# Patient Record
Sex: Female | Born: 1980 | Race: White | Hispanic: No | Marital: Single | Smoking: Current every day smoker
Health system: Southern US, Community
[De-identification: ages and names within clinical notes are randomized; demographics above are authoritative.]

## PROBLEM LIST (undated history)

## (undated) DIAGNOSIS — F419 Anxiety disorder, unspecified: Secondary | ICD-10-CM

## (undated) DIAGNOSIS — N200 Calculus of kidney: Secondary | ICD-10-CM

## (undated) DIAGNOSIS — E039 Hypothyroidism, unspecified: Secondary | ICD-10-CM

## (undated) DIAGNOSIS — E0500 Thyrotoxicosis with diffuse goiter without thyrotoxic crisis or storm: Secondary | ICD-10-CM

## (undated) DIAGNOSIS — C73 Malignant neoplasm of thyroid gland: Secondary | ICD-10-CM

## (undated) HISTORY — DX: Thyrotoxicosis with diffuse goiter without thyrotoxic crisis or storm: E05.00

## (undated) HISTORY — DX: Calculus of kidney: N20.0

## (undated) HISTORY — DX: Hypothyroidism, unspecified: E03.9

## (undated) HISTORY — DX: Anxiety disorder, unspecified: F41.9

## (undated) HISTORY — DX: Malignant neoplasm of thyroid gland: C73

---

## 2001-06-02 ENCOUNTER — Emergency Department (HOSPITAL_COMMUNITY): Admission: EM | Admit: 2001-06-02 | Discharge: 2001-06-02 | Payer: Self-pay | Admitting: Emergency Medicine

## 2003-12-21 ENCOUNTER — Inpatient Hospital Stay: Payer: Self-pay | Admitting: Gynecology

## 2004-07-25 ENCOUNTER — Ambulatory Visit (HOSPITAL_COMMUNITY): Admission: RE | Admit: 2004-07-25 | Discharge: 2004-07-25 | Payer: Self-pay | Admitting: Gynecology

## 2004-08-25 ENCOUNTER — Emergency Department: Payer: Self-pay | Admitting: Emergency Medicine

## 2004-09-24 ENCOUNTER — Encounter (HOSPITAL_COMMUNITY): Admission: RE | Admit: 2004-09-24 | Discharge: 2004-11-27 | Payer: Self-pay | Admitting: Endocrinology

## 2004-10-09 ENCOUNTER — Ambulatory Visit (HOSPITAL_COMMUNITY): Admission: RE | Admit: 2004-10-09 | Discharge: 2004-10-09 | Payer: Self-pay | Admitting: Endocrinology

## 2004-10-18 ENCOUNTER — Emergency Department: Payer: Self-pay | Admitting: Internal Medicine

## 2005-06-25 ENCOUNTER — Emergency Department: Payer: Self-pay | Admitting: Emergency Medicine

## 2005-10-09 ENCOUNTER — Encounter: Admission: RE | Admit: 2005-10-09 | Discharge: 2005-10-09 | Payer: Self-pay | Admitting: Unknown Physician Specialty

## 2008-01-07 ENCOUNTER — Encounter: Payer: Self-pay | Admitting: Family Medicine

## 2008-01-07 ENCOUNTER — Ambulatory Visit: Payer: Self-pay | Admitting: Family Medicine

## 2008-09-20 ENCOUNTER — Ambulatory Visit: Payer: Self-pay | Admitting: Obstetrics & Gynecology

## 2009-06-22 ENCOUNTER — Ambulatory Visit: Payer: Self-pay | Admitting: Obstetrics & Gynecology

## 2010-07-17 NOTE — Assessment & Plan Note (Signed)
Latasha Singleton, Latasha Singleton                  ACCOUNT NO.:  [id number]   MEDICAL RECORD NO.:  [id number]          PATIENT TYPE:  POB   LOCATION:  [facility] at [facility]         FACILITY:  [facility]   PHYSICIAN:  Tinnie Gens, MD        DATE OF BIRTH:  1980/07/04   DATE OF SERVICE:  01/07/2008                                  CLINIC NOTE   CHIEF COMPLAINT:  Yearly exam and Pap.   HISTORY OF PRESENT ILLNESS:  Patient is a 30 year old gravida 2, para 2  who is a single mom.  She has had Graves disease and ablation and now  she is hypothyroid.  The patient saw her thyroid doctor who has replaced  Dr. Talmage Nap at [facility] 2 weeks ago and told her her thyroid levels were  normal.  The patient is upset because she has continued panic attacks  and weight gain and generalized anxiety.  She says she is not depressed  but she is just constantly worried and her fears are somewhat  irrational.  She thinks her son is going to die of swine flu.  Patient  also reports panic attacks much worse when she is out in the car.  Patient had a therapist for postpartum depression after the birth of her  son but has not seen one since.  She reports that her endocrinologist  tried to prescribe her Paxil but she was unwilling to take that when she  saw him.  She has a [employer] fast streamer for contraception and that seems to be  working well for her.  Her last Pap was in June of 2007.   PAST MEDICAL HISTORY:  Significant for thyroid problems.   PAST SURGICAL HISTORY:  Negative.   MEDICATIONS:  1. Levoxyl.  2. Ibuprofen as needed.   ALLERGIES:  NONE KNOWN.   OB HISTORY:  She is a G2 P2, 2 vaginal deliveries.   GYN HISTORY:  Menarche at age 63.  Cycles last for 2 days and have light  flow with the Mirena in.  She has no history of abnormal Pap smears.   FAMILY HISTORY:  Diabetes type 2 in her father.   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  She does smoke a 1/2 pack per day for the past  7  years.  She does not drink alcohol or do any other drugs.   Fourteen-point review of systems were reviewed.  Please see GYN history  on the chart.  Positive for weight gain.   EXAM:  VITALS:  Are as noted in the chart.  She is a well-developed,  well-nourished female in no acute distress.  Her weight is 160 pounds.  HEENT:  Normocephalic, atraumatic.  Sclerae are anicteric.  NECK:  Supple.  Normal thyroid.  LUNGS:  Clear bilaterally.  CV:  Regular rate and rhythm.  No rales, gallops, or murmurs.  ABDOMEN:  Soft, nontender, nondistended.  GU:  Normal external female genitalia.  BUS normal.  Vagina is pink and  rugated.  Cervix is parous without lesion.  Uterus is small, anteverted.  No adnexal mass or tenderness.  EXTREMITIES:  No cyanosis, clubbing, or edema.  BREASTS:  Symmetrical with everted nipples.  No masses.  No  supraclavicular or axillary adenopathy.   IMPRESSION:  1. Yearly exam.  2. Generalized anxiety disorder.  3. Hypothyroidism with a history of Graves disease.   PLAN:  1. Pap smear today.  2. We will refer her back to Dr. Talmage Nap for management of her thyroid.  3. We will start her on Lexapro 10 mg 1 p.o. daily to see if this      helps alleviate some of her issues.  She does not want Xanax      although that would be helpful for her panic attacks.  Also, I      have recommended therapy, however, she seems resistant to this      alternative at this time.           ______________________________  Tinnie Gens, MD     TP/MEDQ  D:  01/07/2008  T:  01/07/2008  Job:  [id number]

## 2010-07-17 NOTE — Assessment & Plan Note (Signed)
Latasha Singleton, Latasha Singleton                  ACCOUNT NO.:  [id number]   MEDICAL RECORD NO.:  [id number]          PATIENT TYPE:  POB   LOCATION:  [facility]         FACILITY:  [facility]   PHYSICIAN:  Jaynie Collins, MD     DATE OF BIRTH:  Feb 20, 1981   DATE OF SERVICE:  06/22/2009                                  CLINIC NOTE   CHIEF COMPLAINT:  Left lower back pain, vomiting.   HISTORY OF PRESENT ILLNESS:  The patient is a 30 year old gravida 2,  para 2, who was seen today for evaluation of left lower back pain and  vomiting that started this morning.  The patient does report having  similar episodes during her pregnancy which is attributed to  pyelonephritis for which she was treated.  She denies any fevers,  chills, sweats.  She does endorse some dysuria.  She denies any other  symptoms.   PHYSICAL EXAMINATION:  VITAL SIGNS:  On examination, her temperature is  96.5, pulse 74, blood pressure 135/90, height 5 feet 6 inches, weight  144 pounds.  GENERAL:  No apparent distress.  ABDOMEN:  Suprapubic and lower abdominal and flank pain especially on  her left side.  The patient does have left CVA tenderness.   A urinalysis that was done in the office showed moderate blood with a  small amount of leukocytes.   IMPRESSION:  The patient likely has a urinary tract infection, possible  pyelonephritis, possible kidney stone.   PLAN:  She was given prescription for levofloxacin 500 mg daily for 10  days also given a prescription for Pyridium 100 mg p.o. t.i.d. for 3  days, and ibuprofen as needed for pain.  She was told to drink a lot of  water to see if that can help with her passing the kidney stones.  If it  is a kidney stone, she was told to call back or go to the emergency  room, if she starts to have a lot of vomiting and is unable to tolerate  the antibiotic therapy, or if she has any fevers or any worsening  symptoms as she might need a renal ultrasound to rule out a stone versus  an  abscess, if her symptoms do not improve.  The patient verbalized  understanding of plan and she was told to go immediately to pharmacy and  fill her prescriptions after this encounter and let us know if her  symptoms worsen.           ______________________________  Jaynie Collins, MD     UA/MEDQ  D:  06/22/2009  T:  06/22/2009  Job:  [id number]

## 2010-07-17 NOTE — Assessment & Plan Note (Signed)
Latasha Singleton, Latasha Singleton                  ACCOUNT NO.:  [id number]   MEDICAL RECORD NO.:  [id number]          PATIENT TYPE:  POB   LOCATION:  [facility]         FACILITY:  [facility]   PHYSICIAN:  Scheryl Darter, MD       DATE OF BIRTH:  Nov 25, 1980   DATE OF SERVICE:                                  CLINIC NOTE   The patient is a 30 year old white female, gravida 2, para 2, last  menstrual period was about a week or 2 ago.  She has had light menstrual  periods with Mirena in place, and her last period was little heavier  than usual, but only lasted few days.  Her Mirena was placed over 5  years ago and she would like to have this removed and replaced.  The  procedure for removal and replacement of Mirena was discussed including  the risks of bleeding, pain, infection, uterine perforation, and her  questions were answered and she signed consent.   PHYSICAL EXAMINATION:  GENERAL:  The patient is in no acute distress.  Normal affect.  PELVIC:  External genitalia, vagina, and cervix appeared normal with  fair amount of normal-appearing cervical mucus.  The string was not  visible.   Uterine dressing forceps were used to locate the string and the IUD was  removed intact.  Cervix was prepped with Betadine and grasped with  single-tooth tenaculum.  We are unable to pass the sound without  dilation of the cervix.  Uterus appeared to be retroverted.  Cervix was  dilated sufficiently and passed the uterine sound, and uterus sounded to  9 cm.  Mirena was placed in usual fashion without difficulty after  cervical dilatation.  String was trimmed to about 2 cm.  All instruments  were removed.  The patient tolerated this well.  She will return in 4  weeks for an IUD check, and she was given precautions to notify us for  increased pain, sign of the infection, or heavy bleeding.      Scheryl Darter, MD     JA/MEDQ  D:  09/20/2008  T:  09/21/2008  Job:  [id number]

## 2010-12-04 ENCOUNTER — Encounter: Payer: Self-pay | Admitting: Family Medicine

## 2010-12-04 ENCOUNTER — Ambulatory Visit (INDEPENDENT_AMBULATORY_CARE_PROVIDER_SITE_OTHER): Payer: Medicaid Other | Admitting: Family Medicine

## 2010-12-04 DIAGNOSIS — N200 Calculus of kidney: Secondary | ICD-10-CM | POA: Insufficient documentation

## 2010-12-04 DIAGNOSIS — F419 Anxiety disorder, unspecified: Secondary | ICD-10-CM

## 2010-12-04 DIAGNOSIS — E0500 Thyrotoxicosis with diffuse goiter without thyrotoxic crisis or storm: Secondary | ICD-10-CM | POA: Insufficient documentation

## 2010-12-04 DIAGNOSIS — F411 Generalized anxiety disorder: Secondary | ICD-10-CM

## 2010-12-04 DIAGNOSIS — Z20828 Contact with and (suspected) exposure to other viral communicable diseases: Secondary | ICD-10-CM

## 2010-12-04 DIAGNOSIS — Z205 Contact with and (suspected) exposure to viral hepatitis: Secondary | ICD-10-CM

## 2010-12-04 DIAGNOSIS — E039 Hypothyroidism, unspecified: Secondary | ICD-10-CM | POA: Insufficient documentation

## 2010-12-04 MED ORDER — ALPRAZOLAM 0.25 MG PO TABS: 0.2500 mg | ORAL_TABLET | Freq: Three times a day (TID) | ORAL | Status: AC | PRN

## 2010-12-04 MED ORDER — ESCITALOPRAM OXALATE 10 MG PO TABS: 10.0000 mg | ORAL_TABLET | Freq: Every day | ORAL | Status: DC

## 2010-12-04 NOTE — Progress Notes (Signed)
  Subjective:    Patient ID: Latasha Singleton, female    DOB: Oct 29, 1980, 30 y.o.   MRN: [id number]  HPI Here with lots of anxiety and panic attacks.  Reports that her father has Hep C.  Would like to be checked. 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   Review of Systems  Constitutional: Negative for appetite change.  HENT: Negative for congestion.   Respiratory: Positive for chest tightness and shortness of breath.   Gastrointestinal: Negative for nausea, abdominal pain, diarrhea and constipation.  Genitourinary: Negative for dysuria.  Musculoskeletal: Negative for arthralgias.  Neurological: Positive for tremors and light-headedness.  Psychiatric/Behavioral: Positive for sleep disturbance. Negative for suicidal ideas, hallucinations, confusion and dysphoric mood.       Objective:   Physical Exam  Vitals reviewed. Constitutional: She is oriented to person, place, and time. She appears well-developed and well-nourished.  HENT:  Head: Normocephalic.  Cardiovascular: Normal rate.   Pulmonary/Chest: Effort normal.  Abdominal: Soft.  Neurological: She is alert and oriented to person, place, and time.  Skin: Skin is warm and dry.          Assessment & Plan:  Acute anxiety disorder Panic attacks Trial of Lexapro with Xanax prn panic attacks.

## 2010-12-04 NOTE — Patient Instructions (Signed)
Anxiety and Panic Attacks Your caregiver has informed you that you are having an anxiety or panic attack. There may be many forms of this. Most of the time these attacks come suddenly and without warning. They come at any time of day, including periods of sleep, and at any time of life. They may be strong and unexplained. Although panic attacks are very scary, they are physically harmless. Sometimes the cause of your anxiety is not known. Anxiety is a protective mechanism of the body in its fight or flight mechanism. Most of these perceived danger situations are actually nonphysical situations (such as anxiety over losing a job). CAUSES The causes of an anxiety or panic attack are many. Panic attacks may occur in otherwise healthy people given a certain set of circumstances. There may be a genetic cause for panic attacks. Some medications may also have anxiety as a side effect. SYMPTOMS Some of the most common feelings are:  Intense terror.  Dizziness, feeling faint.   Hot and cold flashes.   Fear of going crazy.   Feelings that nothing is real.   Sweating.   Shaking.   Chest pain or a fast heartbeat (palpitations).  Smothering, choking sensations.   Feelings of impending doom and that death is near.   Tingling of extremities, this may be from over breathing.   Altered reality (derealization).   Being detached from yourself (depersonalization).   Several symptoms can be present to make up anxiety or panic attacks. DIAGNOSIS The evaluation by your caregiver will depend on the type of symptoms you are experiencing. The diagnosis of anxiety or pain attack is made when no physical illness can be determined to be a cause of the symptoms. TREATMENT Treatment to prevent anxiety and panic attacks may include:  Avoidance of circumstances that cause anxiety.   Reassurance and relaxation.   Regular exercise.   Relaxation therapies, such as yoga.   Psychotherapy with a psychiatrist  or therapist.   Avoidance of caffeine, alcohol and illegal drugs.   Prescribed medication.  SEEK IMMEDIATE MEDICAL CARE IF:  You experience panic attack symptoms that are different than your usual symptoms.   You have any worsening or concerning symptoms.  Document Released: 02/18/2005 Document Re-Released: 08/08/2009 [employer] Patient Information 2011 [employer]. 

## 2011-03-18 ENCOUNTER — Other Ambulatory Visit: Payer: Self-pay | Admitting: *Deleted

## 2011-03-18 MED ORDER — ESCITALOPRAM OXALATE 10 MG PO TABS: 10.0000 mg | ORAL_TABLET | Freq: Every day | ORAL | Status: DC

## 2011-03-18 NOTE — Telephone Encounter (Signed)
Patient is in need of her Lexapro refill.  She made appointment for Feb 5 with Dr. Shawnie Pons.  She is doing great with the Lexapro!

## 2011-04-09 ENCOUNTER — Ambulatory Visit: Payer: Medicaid Other | Admitting: Family Medicine

## 2011-04-29 ENCOUNTER — Ambulatory Visit (INDEPENDENT_AMBULATORY_CARE_PROVIDER_SITE_OTHER): Payer: Medicaid Other | Admitting: Family Medicine

## 2011-04-29 ENCOUNTER — Other Ambulatory Visit (HOSPITAL_COMMUNITY)
Admission: RE | Admit: 2011-04-29 | Discharge: 2011-04-29 | Disposition: A | Discharge status: Home / Self Care | Payer: Medicaid Other | Source: Ambulatory Visit | Attending: Family Medicine | Admitting: Family Medicine

## 2011-04-29 ENCOUNTER — Encounter: Payer: Self-pay | Admitting: Family Medicine

## 2011-04-29 VITALS — BP 106/63 | HR 79 | Ht 65.5 in | Wt 156.0 lb

## 2011-04-29 DIAGNOSIS — Z01419 Encounter for gynecological examination (general) (routine) without abnormal findings: Secondary | ICD-10-CM | POA: Insufficient documentation

## 2011-04-29 DIAGNOSIS — F419 Anxiety disorder, unspecified: Secondary | ICD-10-CM

## 2011-04-29 DIAGNOSIS — E039 Hypothyroidism, unspecified: Secondary | ICD-10-CM

## 2011-04-29 DIAGNOSIS — F411 Generalized anxiety disorder: Secondary | ICD-10-CM

## 2011-04-29 MED ORDER — LEVOTHYROXINE SODIUM 125 MCG PO TABS: 125.0000 ug | ORAL_TABLET | Freq: Every day | ORAL | Status: DC

## 2011-04-29 MED ORDER — ESCITALOPRAM OXALATE 20 MG PO TABS: 10.0000 mg | ORAL_TABLET | Freq: Every day | ORAL | Status: DC

## 2011-04-29 NOTE — Progress Notes (Signed)
  Subjective:     Latasha Singleton is a 31 y.o. female and is here for a comprehensive physical exam. The patient reports problems - Feeling better with switch from Celexa to Lexapro.Marland Kitchen  History   Social History  . Marital Status: Married    Spouse Name: N/A    Number of Children: N/A  . Years of Education: N/A   Occupational History  . Not on file.   Social History Main Topics  . Smoking status: Current Everyday Smoker -- 1.0 packs/day    Types: Cigarettes  . Smokeless tobacco: Not on file  . Alcohol Use: Yes     occassionally  . Drug Use: No  . Sexually Active: Not Currently -- Female partner(s)   Other Topics Concern  . Not on file   Social History Narrative  . No narrative on file   Health Maintenance  Topic Date Due  . Pap Smear  10/01/1998  . Tetanus/tdap  10/01/1999  . Influenza Vaccine  12/03/2010    The following portions of the patient's history were reviewed and updated as appropriate: allergies, current medications, past family history, past medical history, past social history, past surgical history and problem list.  Review of Systems A comprehensive review of systems was negative.   Objective:    BP 106/63  Pulse 79  Ht 5' 5.5" (1.664 m)  Wt 156 lb (70.761 kg)  BMI 25.56 kg/m2  LMP 04/15/2011 General appearance: alert, cooperative and appears stated age Head: Normocephalic, without obvious abnormality, atraumatic Neck: no adenopathy, supple, symmetrical, trachea midline and thyroid not enlarged, symmetric, no tenderness/mass/nodules Lungs: clear to auscultation bilaterally Breasts: normal appearance, no masses or tenderness Heart: regular rate and rhythm, S1, S2 normal, no murmur, click, rub or gallop Abdomen: soft, non-tender; bowel sounds normal; no masses,  no organomegaly Pelvic: cervix normal in appearance, external genitalia normal, no adnexal masses or tenderness, no cervical motion tenderness, uterus normal size, shape, and consistency and vagina  normal without discharge Extremities: extremities normal, atraumatic, no cyanosis or edema Pulses: 2+ and symmetric Skin: Skin color, texture, turgor normal. No rashes or lesions Lymph nodes: Cervical, supraclavicular, and axillary nodes normal. Neurologic: Grossly normal    Assessment:    Healthy female exam.  GAD with panic attacks, improved.     Plan:    Pap smear today Increase her lexapro to 20. See After Visit Summary for Counseling Recommendations

## 2011-04-29 NOTE — Patient Instructions (Signed)
Anxiety and Panic Attacks Your caregiver has informed you that you are having an anxiety or panic attack. There may be many forms of this. Most of the time these attacks come suddenly and without warning. They come at any time of day, including periods of sleep, and at any time of life. They may be strong and unexplained. Although panic attacks are very scary, they are physically harmless. Sometimes the cause of your anxiety is not known. Anxiety is a protective mechanism of the body in its fight or flight mechanism. Most of these perceived danger situations are actually nonphysical situations (such as anxiety over losing a job). CAUSES  The causes of an anxiety or panic attack are many. Panic attacks may occur in otherwise healthy people given a certain set of circumstances. There may be a genetic cause for panic attacks. Some medications may also have anxiety as a side effect. SYMPTOMS  Some of the most common feelings are:  Intense terror.   Dizziness, feeling faint.   Hot and cold flashes.   Fear of going crazy.   Feelings that nothing is real.   Sweating.   Shaking.   Chest pain or a fast heartbeat (palpitations).   Smothering, choking sensations.   Feelings of impending doom and that death is near.   Tingling of extremities, this may be from over-breathing.   Altered reality (derealization).   Being detached from yourself (depersonalization).  Several symptoms can be present to make up anxiety or panic attacks. DIAGNOSIS  The evaluation by your caregiver will depend on the type of symptoms you are experiencing. The diagnosis of anxiety or panic attack is made when no physical illness can be determined to be a cause of the symptoms. TREATMENT  Treatment to prevent anxiety and panic attacks may include:  Avoidance of circumstances that cause anxiety.   Reassurance and relaxation.   Regular exercise.   Relaxation therapies, such as yoga.   Psychotherapy with a  psychiatrist or therapist.   Avoidance of caffeine, alcohol and illegal drugs.   Prescribed medication.  SEEK IMMEDIATE MEDICAL CARE IF:   You experience panic attack symptoms that are different than your usual symptoms.   You have any worsening or concerning symptoms.  Document Released: 02/18/2005 Document Revised: 10/31/2010 Document Reviewed: 06/22/2009 [facility] Patient Information 2012 [place], [place].Preventative Care for Adults, Female A healthy lifestyle and preventative care can promote health and wellness. Preventative health guidelines for women include the following key practices:  A routine yearly physical is a good way to check with your caregiver about your health and preventative screening. It is a chance to share any concerns and updates on your health, and to receive a thorough exam.   Visit your dentist for a routine exam and preventative care every 6 months. Brush your teeth twice a day and floss once a day. Good oral hygiene prevents tooth decay and gum disease.   The frequency of eye exams is based on your age, health, family medical history, use of contact lenses, and other factors. Follow your caregiver's recommendations for frequency of eye exams.   Eat a healthy diet. Foods like vegetables, fruits, whole grains, low-fat dairy products, and lean protein foods contain the nutrients you need without too many calories. Decrease your intake of foods high in solid fats, added sugars, and salt. Eat the right amount of calories for you.Get information about a proper diet from your caregiver, if necessary.   Regular physical exercise is one of the most important things you  can do for your health. Most adults should get at least 150 minutes of moderate-intensity exercise (any activity that increases your heart rate and causes you to sweat) each week. In addition, most adults need muscle-strengthening exercises on 2 or more days a week.   Maintain a healthy weight. The body  mass index (BMI) is a screening tool to identify possible weight problems. It provides an estimate of body fat based on height and weight. Your caregiver can help determine your BMI, and can help you achieve or maintain a healthy weight.For adults 20 years and older:   A BMI below 18.5 is considered underweight.   A BMI of 18.5 to 24.9 is normal.   A BMI of 25 to 29.9 is considered overweight.   A BMI of 30 and above is considered obese.   Maintain normal blood lipids and cholesterol levels by exercising and minimizing your intake of saturated fat. Eat a balanced diet with plenty of fruit and vegetables. Blood tests for lipids and cholesterol should begin at age 69 and be repeated every 5 years. If your lipid or cholesterol levels are high, you are over 50, or you are a high risk for heart disease, you may need your cholesterol levels checked more frequently.Ongoing high lipid and cholesterol levels should be treated with medicines if diet and exercise are not effective.   If you smoke, find out from your caregiver how to quit. If you do not use tobacco, do not start.   If you are pregnant, do not drink alcohol. If you are breastfeeding, be very cautious about drinking alcohol. If you are not pregnant and choose to drink alcohol, do not exceed 1 drink per day. One drink is considered to be 12 ounces (355 mL) of beer, 5 ounces (148 mL) of wine, or 1.5 ounces (44 mL) of liquor.   Avoid use of street drugs. Do not share needles with anyone. Ask for help if you need support or instructions about stopping the use of drugs.   High blood pressure causes heart disease and increases the risk of stroke. Your blood pressure should be checked at least every 1 to 2 years. Ongoing high blood pressure should be treated with medicines if weight loss and exercise are not effective.   If you are 41 to 31 years old, ask your caregiver if you should take aspirin to prevent strokes.   Diabetes screening involves  taking a blood sample to check your fasting blood sugar level. This should be done once every 3 years, after age 58, if you are within normal weight and without risk factors for diabetes. Testing should be considered at a younger age or be carried out more frequently if you are overweight and have at least 1 risk factor for diabetes.   Breast cancer screening is essential preventative care for women. You should practice "breast self-awareness." This means understanding the normal appearance and feel of your breasts and may include breast self-examination. Any changes detected, no matter how small, should be reported to a caregiver. Women in their 3s and 30s should have a clinical breast exam (CBE) by a caregiver as part of a regular health exam every 1 to 3 years. After age 32, women should have a CBE every year. Starting at age 58, women should consider having a mammogram (breast X-ray) every year. Women who have a family history of breast cancer should talk to their caregiver about genetic screening. Women at a high risk of breast cancer should talk  to their caregiver about having an MRI and a mammogram every year.   The Pap test is a screening test for cervical cancer. A Pap test can show cell changes on the cervix that might become cervical cancer if left untreated. A Pap test is a procedure in which cells are obtained and examined from the lower end of the uterus (cervix).   Women should have a Pap test starting at age 31.   Between ages 47 and 28, Pap tests should be repeated every 2 years.   Beginning at age 65, you should have a Pap test every 3 years as long as the past 3 Pap tests have been normal.   Some women have medical problems that increase the chance of getting cervical cancer. Talk to your caregiver about these problems. It is especially important to talk to your caregiver if a new problem develops soon after your last Pap test. In these cases, your caregiver may recommend more  frequent screening and Pap tests.   The above recommendations are the same for women who have or have not gotten the vaccine for human papillomavirus (HPV).   If you had a hysterectomy for a problem that was not cancer or a condition that could lead to cancer, then you no longer need Pap tests. Even if you no longer need a Pap test, a regular exam is a good idea to make sure no other problems are starting.   If you are between ages 74 and 7, and you have had normal Pap tests going back 10 years, you no longer need Pap tests. Even if you no longer need a Pap test, a regular exam is a good idea to make sure no other problems are starting.   If you have had past treatment for cervical cancer or a condition that could lead to cancer, you need Pap tests and screening for cancer for at least 20 years after your treatment.   If Pap tests have been discontinued, risk factors (such as a new sexual partner) need to be reassessed to determine if screening should be resumed.   The HPV test is an additional test that may be used for cervical cancer screening. The HPV test looks for the virus that can cause the cell changes on the cervix. The cells collected during the Pap test can be tested for HPV. The HPV test could be used to screen women aged 22 years and older, and should be used in women of any age who have unclear Pap test results. After the age of 20, women should have HPV testing at the same frequency as a Pap test.   Colorectal cancer can be detected and often prevented. Most routine colorectal cancer screening begins at the age of 58 and continues through age 23. However, your caregiver may recommend screening at an earlier age if you have risk factors for colon cancer. On a yearly basis, your caregiver may provide home test kits to check for hidden blood in the stool. Use of a small camera at the end of a tube, to directly examine the colon (sigmoidoscopy or colonoscopy), can detect the earliest forms  of colorectal cancer. Talk to your caregiver about this at age 33, when routine screening begins. Direct examination of the colon should be repeated every 5 to 10 years through age 27, unless early forms of pre-cancerous polyps or small growths are found.   Practice safe sex. Use condoms and avoid high-risk sexual practices to reduce the spread of  sexually transmitted infections (STIs). STIs include gonorrhea, chlamydia, syphilis, trichomonas, herpes, HPV, and human immunodeficiency virus (HIV). Herpes, HIV, and HPV are viral illnesses that have no cure. They can result in disability, cancer, and death. Sexually active women aged 61 and younger should be checked for Chlamydia. Older women with new or multiple partners should also be tested for Chlamydia. Testing for other STIs is recommended if you are sexually active and at increased risk.   Osteoporosis is a disease in which the bones lose minerals and strength with aging. This can result in serious bone fractures. The risk of osteoporosis can be identified using a bone density scan. Women ages 63 and over and women at risk for fractures or osteoporosis should discuss screening with their caregivers. Ask your caregiver whether you should take a calcium supplement or vitamin D to reduce the rate of osteoporosis.   Menopause can be associated with physical symptoms and risks. Hormone replacement therapy is available to decrease symptoms and risks. You should talk to your caregiver about whether hormone replacement therapy is right for you.   Use sunscreen with skin protection factor (SPF) of 30 or more. Apply sunscreen liberally and repeatedly throughout the day. You should seek shade when your shadow is shorter than you. Protect yourself by wearing long sleeves, pants, a wide-brimmed hat, and sunglasses year round, whenever you are outdoors.   Once a month, do a whole body skin exam, using a mirror to look at the skin on your back. Notify your caregiver  of new moles, moles that have irregular borders, moles that are larger than a pencil eraser, or moles that have changed in shape or color.   Stay current with required immunizations.   Influenza. You need a dose every fall (or winter). The composition of the flu vaccine changes each year, so being vaccinated once is not enough.   Pneumococcal polysaccharide. You need 1 to 2 doses if you smoke cigarettes or if you have certain chronic medical conditions. You need 1 dose at age 51 (or older) if you have never been vaccinated.   Tetanus, diphtheria, pertussis (Tdap, Td). Get 1 dose of Tdap vaccine if you are younger than age 44 years, are over 52 and have contact with an infant, are a Research scientist (physical sciences), are pregnant, or simply want to be protected from whooping cough. After that, you need a Td booster dose every 10 years. Consult your caregiver if you have not had at least 3 tetanus and diphtheria-containing shots sometime in your life or have a deep or dirty wound.   HPV. You need this vaccine if you are a woman age 58 years or younger. The vaccine is given in 3 doses over 6 months.   Measles, mumps, rubella (MMR). You need at least 1 dose of MMR if you were born in 1957 or later. You may also need a 2nd dose.   Meningococcal. If you are age 70 to 46 years and a Orthoptist living in a residence hall, or have one of several medical conditions, you need to get vaccinated against meningococcal disease. You may also need additional booster doses.   Zoster (shingles). If you are age 110 years or older, you should get this vaccine.   Varicella (chickenpox). If you have never had chickenpox or you were vaccinated but received only 1 dose, talk to your caregiver to find out if you need this vaccine.   Hepatitis A. You need this vaccine if you have a specific risk  factor for hepatitis A virus infection or you simply wish to be protected from this disease. The vaccine is usually given as 2  doses, 6 to 18 months apart.   Hepatitis B. You need this vaccine if you have a specific risk factor for hepatitis B virus infection or you simply wish to be protected from this disease. The vaccine is given in 3 doses, usually over 6 months.  Preventative Services / Frequency Ages 12 to 43  Blood pressure check.** / Every 1 to 2 years.   Lipid and cholesterol check.**/ Every 5 years beginning at age 25.   Clinical breast exam.** / Every 3 years for women in their 49s and 30s.   Pap Test.** / Every 2 years from ages 22 through 41. Every 3 years starting at age 21 years through age 56 or 43 with a history of 3 consecutive normal Pap tests.   HPV Screening.** / Every 3 years from ages 66 through ages 104 to 58 with a history of 3 consecutive normal Pap tests.   Skin self-exam. / Monthly.   Influenza immunization.** / Every year.   Pneumococcal polysaccharide immunization.** / 1 to 2 doses if you smoke cigarettes or if you have certain chronic medical conditions.   Tetanus, diphtheria, pertussis (Tdap,Td) immunization. / A one-time dose of Tdap vaccine. After that, you need a Td booster dose every 10 years.   HPV immunization. / 3 doses over 6 months, if 26 and younger.   Measles, mumps, rubella (MMR) immunization. / You need at least 1 dose of MMR if you were born in 1957 or later. You may also need a 2nd dose.   Meningococcal immunization. / 1 dose if you are age 76 to 6 years and a Orthoptist living in a residence hall, or have one of several medical conditions, you need to get vaccinated against meningococcal disease. You may also need additional booster doses.   Varicella immunization. **/ Consult your caregiver.   Hepatitis A immunization. ** / Consult your caregiver. 2 doses, 6 to 18 months apart.   Hepatitis B immunization.** / Consult your caregiver. 3 doses usually over 6 months.  Ages 40 to 92  Blood pressure check.** / Every 1 to 2 years.   Lipid and  cholesterol check.**/ Every 5 years beginning at age 72.   Clinical breast exam.** / Every year after age 31.   Mammogram.** / Every year beginning at age 46 and continuing for as long as you are in good health. Consult with your caregiver.   Pap Test.** / Every 3 years starting at age 21 years through age 79 or 58 with a history of 3 consecutive normal Pap tests.   HPV Screening.** / Every 3 years from ages 37 through ages 40 to 64 with a history of 3 consecutive normal Pap tests.   Fecal occult blood test (FOBT) of stool. / Every year beginning at age 75 and continuing until age 11. You may not have to do this test if you get colonoscopy every 10 years.   Flexible sigmoidoscopy** or colonoscopy.** / Every 5 years for a flexible sigmoidoscopy or every 10 years for a colonoscopy beginning at age 57 and continuing until age 69.   Skin self-exam. / Monthly.   Influenza immunization.** / Every year.   Pneumococcal polysaccharide immunization.** / 1 to 2 doses if you smoke cigarettes or if you have certain chronic medical conditions.   Tetanus, diphtheria, pertussis (Tdap/Td) immunization.** / A one-time dose  of Tdap vaccine. After that, you need a Td booster dose every 10 years.   Measles, mumps, rubella (MMR) immunization. / You need at least 1 dose of MMR if you were born in 1957 or later. You may also need a 2nd dose.   Varicella immunization. **/ Consult your caregiver.   Meningococcal immunization.** / Consult your caregiver.     Hepatitis A immunization. ** / Consult your caregiver. 2 doses, 6 to 18 months apart.   Hepatitis B immunization.** / Consult your caregiver. 3 doses, usually over 6 months.  Ages 57 and over  Blood pressure check.** / Every 1 to 2 years.   Lipid and cholesterol check.**/ Every 5 years beginning at age 80.   Clinical breast exam.** / Every year after age 53.   Mammogram.** / Every year beginning at age 58 and continuing for as long as you are in  good health. Consult with your caregiver.   Pap Test,** / Every 3 years starting at age 57 years through age 82 or 77 with a 3 consecutive normal Pap tests. Testing can be stopped between 65 and 70 with 3 consecutive normal Pap tests and no abnormal Pap or HPV tests in the past 10 years.   HPV Screening.** / Every 3 years from ages 41 through ages 68 or 45 with a history of 3 consecutive normal Pap tests. Testing can be stopped between 65 and 70 with 3 consecutive normal Pap tests and no abnormal Pap or HPV tests in the past 10 years.   Fecal occult blood test (FOBT) of stool. / Every year beginning at age 57 and continuing until age 22. You may not have to do this test if you get colonoscopy every 10 years.   Flexible sigmoidoscopy** or colonoscopy.** / Every 5 years for a flexible sigmoidoscopy or every 10 years for a colonoscopy beginning at age 75 and continuing until age 77.   Osteoporosis screening.** / A one-time screening for women ages 43 and over and women at risk for fractures or osteoporosis.   Skin self-exam. / Monthly.   Influenza immunization.** / Every year.   Pneumococcal polysaccharide immunization.** / 1 dose at age 33 (or older) if you have never been vaccinated.   Tetanus, diphtheria, pertussis (Tdap, Td) immunization. / A one-time dose of Tdap vaccine if you are over 65 and have contact with an infant, are a Research scientist (physical sciences), or simply want to be protected from whooping cough. After that, you need a Td booster dose every 10 years.   Varicella immunization. **/ Consult your caregiver.   Meningococcal immunization.** / Consult your caregiver.   Hepatitis A immunization. ** / Consult your caregiver. 2 doses, 6 to 18 months apart.   Hepatitis B immunization.** / Check with your caregiver. 3 doses, usually over 6 months.  ** Family history and personal history of risk and conditions may change your caregiver's recommendations. Document Released: 04/16/2001 Document  Revised: 10/31/2010 Document Reviewed: 07/16/2010 [facility] Patient Information 2012 [place], [place].

## 2011-04-29 NOTE — Progress Notes (Signed)
Patient is here for routine exam.  No concerns.

## 2011-05-07 ENCOUNTER — Other Ambulatory Visit (INDEPENDENT_AMBULATORY_CARE_PROVIDER_SITE_OTHER): Payer: Medicaid Other | Admitting: *Deleted

## 2011-05-07 DIAGNOSIS — Z205 Contact with and (suspected) exposure to viral hepatitis: Secondary | ICD-10-CM

## 2011-05-07 DIAGNOSIS — Z20828 Contact with and (suspected) exposure to other viral communicable diseases: Secondary | ICD-10-CM

## 2011-05-07 DIAGNOSIS — Z01419 Encounter for gynecological examination (general) (routine) without abnormal findings: Secondary | ICD-10-CM

## 2011-05-07 LAB — LIPID PANEL
Cholesterol: 175 mg/dL (ref 0–200)
Total CHOL/HDL Ratio: 3 Ratio
Triglycerides: 50 mg/dL (ref <150)

## 2011-05-07 LAB — COMPREHENSIVE METABOLIC PANEL
Albumin: 4.6 g/dL (ref 3.5–5.2)
BUN: 16 mg/dL (ref 6–23)
Calcium: 9.5 mg/dL (ref 8.4–10.5)
Chloride: 105 mEq/L (ref 96–112)
Potassium: 4.1 mEq/L (ref 3.5–5.3)
Sodium: 137 mEq/L (ref 135–145)

## 2011-05-07 NOTE — Progress Notes (Signed)
Patient is here today for fasting labs. 

## 2011-05-08 LAB — CBC
Hemoglobin: 14.2 g/dL (ref 12.0–15.0)
MCV: 91.1 fL (ref 78.0–100.0)
RDW: 14.2 % (ref 11.5–15.5)
WBC: 6.7 10*3/uL (ref 4.0–10.5)

## 2011-05-08 LAB — HEPATITIS C ANTIBODY: HCV Ab: NEGATIVE

## 2011-08-28 ENCOUNTER — Telehealth: Payer: Self-pay | Admitting: *Deleted

## 2011-08-28 DIAGNOSIS — E039 Hypothyroidism, unspecified: Secondary | ICD-10-CM

## 2011-08-28 MED ORDER — LEVOTHYROXINE SODIUM 125 MCG PO TABS: 125.0000 ug | ORAL_TABLET | Freq: Every day | ORAL | Status: DC

## 2011-08-28 NOTE — Telephone Encounter (Signed)
Patient needs refill of her synthroid medication it has been two years since she saw the specialist so they will not refill it for her without an appointment and they are not able to get her in.  She had her level checked here in March and it was normal, she has taken the same dose for several years.  I have refilled her meds for her and she will follow up if she needs a new referral to an endocrinologist.

## 2012-05-19 ENCOUNTER — Ambulatory Visit: Payer: Medicaid Other | Admitting: Family Medicine

## 2012-06-02 ENCOUNTER — Ambulatory Visit: Payer: Medicaid Other | Admitting: Family Medicine

## 2012-06-02 DIAGNOSIS — Z01419 Encounter for gynecological examination (general) (routine) without abnormal findings: Secondary | ICD-10-CM

## 2012-09-22 ENCOUNTER — Telehealth: Payer: Self-pay

## 2012-09-22 NOTE — Telephone Encounter (Signed)
This patient is out of town and is out of her Lexapro and Thyroid medicine. She is past due and has not made an appointment. I have told patient I would check with you, can you fill her med's for a week only, Inetta Fermo had already giving her an extension and she still has not made an appointment, we don't want her to throw herself off a cliff in [place] if she doesn't take her medicine. She needs it called in to a pharmacy in [place] # [phone] [facility] on [address] Thanks!

## 2012-09-24 NOTE — Telephone Encounter (Signed)
It's ok to send in a one week course--I am on vacation--

## 2012-10-27 ENCOUNTER — Ambulatory Visit: Payer: Medicaid Other | Admitting: Family Medicine

## 2013-09-21 ENCOUNTER — Ambulatory Visit (INDEPENDENT_AMBULATORY_CARE_PROVIDER_SITE_OTHER): Payer: Medicaid Other | Admitting: Obstetrics & Gynecology

## 2013-09-21 ENCOUNTER — Encounter: Payer: Self-pay | Admitting: Obstetrics & Gynecology

## 2013-09-21 VITALS — BP 108/77 | HR 76 | Ht 66.0 in | Wt 174.0 lb

## 2013-09-21 DIAGNOSIS — Z975 Presence of (intrauterine) contraceptive device: Secondary | ICD-10-CM

## 2013-09-21 DIAGNOSIS — Z30430 Encounter for insertion of intrauterine contraceptive device: Secondary | ICD-10-CM

## 2013-09-21 NOTE — Progress Notes (Signed)
   Subjective:    Patient ID: Latasha Singleton, female    DOB: 12/02/1980, 33 y.o.   MRN: [id number]  HPI  33 yo SW lady who is here to have her newly expired Mirena replaced with a new one. This will be her third Mirena.   Review of Systems She is due for a pap and fasting labs.    Objective:   Physical Exam  UPT negative, consent signed, Time out procedure done. Cervix prepped with betadine and grasped with a single tooth tenaculum. Mirena was easily placed and the strings were cut to 3-4 cm. Uterus sounded to 9 cm. She tolerated the procedure well.        Assessment & Plan:  Preventative- schedule annual and fasting labs with string check next month Contraception- Mirena

## 2013-10-15 ENCOUNTER — Ambulatory Visit: Payer: Medicaid Other | Admitting: Obstetrics & Gynecology

## 2013-11-17 ENCOUNTER — Emergency Department: Payer: Self-pay | Admitting: Emergency Medicine

## 2014-01-03 ENCOUNTER — Encounter: Payer: Self-pay | Admitting: Obstetrics & Gynecology

## 2014-01-24 ENCOUNTER — Emergency Department: Payer: Self-pay | Admitting: Emergency Medicine

## 2015-04-06 ENCOUNTER — Other Ambulatory Visit: Payer: Self-pay | Admitting: Obstetrics and Gynecology

## 2015-04-06 DIAGNOSIS — N6320 Unspecified lump in the left breast, unspecified quadrant: Secondary | ICD-10-CM

## 2015-04-11 ENCOUNTER — Ambulatory Visit
Admission: RE | Admit: 2015-04-11 | Discharge: 2015-04-11 | Disposition: A | Discharge status: Home / Self Care | Payer: 59 | Source: Ambulatory Visit | Attending: Obstetrics and Gynecology | Admitting: Obstetrics and Gynecology

## 2015-04-11 ENCOUNTER — Other Ambulatory Visit: Payer: Self-pay | Admitting: Obstetrics and Gynecology

## 2015-04-11 DIAGNOSIS — N644 Mastodynia: Secondary | ICD-10-CM | POA: Insufficient documentation

## 2015-04-11 DIAGNOSIS — N6320 Unspecified lump in the left breast, unspecified quadrant: Secondary | ICD-10-CM

## 2015-04-11 DIAGNOSIS — R928 Other abnormal and inconclusive findings on diagnostic imaging of breast: Secondary | ICD-10-CM | POA: Diagnosis not present

## 2015-04-11 DIAGNOSIS — N63 Unspecified lump in breast: Secondary | ICD-10-CM | POA: Diagnosis present

## 2015-12-06 ENCOUNTER — Other Ambulatory Visit: Payer: Self-pay | Admitting: Obstetrics and Gynecology

## 2015-12-06 DIAGNOSIS — N63 Unspecified lump in unspecified breast: Secondary | ICD-10-CM

## 2015-12-22 ENCOUNTER — Ambulatory Visit
Admission: RE | Admit: 2015-12-22 | Discharge: 2015-12-22 | Disposition: A | Discharge status: Home / Self Care | Payer: 59 | Source: Ambulatory Visit | Attending: Obstetrics and Gynecology | Admitting: Obstetrics and Gynecology

## 2015-12-22 ENCOUNTER — Encounter (HOSPITAL_COMMUNITY): Payer: Self-pay

## 2015-12-22 DIAGNOSIS — N6489 Other specified disorders of breast: Secondary | ICD-10-CM | POA: Diagnosis not present

## 2015-12-22 DIAGNOSIS — N63 Unspecified lump in unspecified breast: Secondary | ICD-10-CM

## 2016-01-14 DIAGNOSIS — Z1379 Encounter for other screening for genetic and chromosomal anomalies: Secondary | ICD-10-CM | POA: Insufficient documentation

## 2016-01-14 NOTE — Progress Notes (Signed)
[place]  Telephone:([phone]) [phone] Fax:(336) [phone]  ID: Latasha Singleton OB: [birth year]  MR#: [id number]  [id number]  No care team member to display  CHIEF COMPLAINT: Genetic screening and testing  INTERVAL HISTORY: Patient is a 35 year old female with no personal history of malignancy, but does have an extensive family history is interested in genetic testing. Currently, she is anxious but otherwise feels well. She has no neurologic complaints. She denies any recent fevers or illnesses. She has good appetite and denies weight loss. She has no chest pain or shortness of breath. She denies any nausea, vomiting, constipation, or diarrhea. She has no urinary complaints. Patient feels at her baseline and offers no specific complaints today.  REVIEW OF SYSTEMS:   Review of Systems  Constitutional: Negative.  Negative for fever, malaise/fatigue and weight loss.  Respiratory: Negative.  Negative for shortness of breath.   Cardiovascular: Negative.  Negative for chest pain and leg swelling.  Gastrointestinal: Negative.  Negative for abdominal pain.  Genitourinary: Negative.   Neurological: Negative.  Negative for weakness.  Psychiatric/Behavioral: The patient is nervous/anxious.     As per HPI. Otherwise, a complete review of systems is negative.  PAST MEDICAL HISTORY: Past Medical History:  Diagnosis Date  . Anxiety   . Cancer ([place])    Throid  . Grave's disease   . Nephrolithiasis     PAST SURGICAL HISTORY: No past surgical history on file.  FAMILY HISTORY: Family History  Problem Relation Age of Onset  . Diabetes Father     TYPE 2  . Hepatitis Father   . Liver disease Father   . Hyperlipidemia Mother   . Anxiety disorder Mother   . Stroke Paternal Grandmother   . Heart disease Paternal Grandmother     heart attack.  Marland Kitchen Heart disease Paternal Grandfather     heart attack  . Breast cancer Maternal Aunt 67  . Breast cancer Maternal Grandmother      ADVANCED DIRECTIVES (Y/N):  N  HEALTH MAINTENANCE: Social History  Substance Use Topics  . Smoking status: Current Every Day Smoker    Packs/day: 1.00    Types: Cigarettes  . Smokeless tobacco: Not on file  . Alcohol use Yes     Comment: occassionally     Colonoscopy:  PAP:  Bone density:  Lipid panel:  No Known Allergies  Current Outpatient Prescriptions  Medication Sig Dispense Refill  . escitalopram (LEXAPRO) 20 MG tablet Take 0.5 tablets (10 mg total) by mouth daily. 30 tablet 11  . IBUPROFEN PO Take by mouth.      . levonorgestrel (MIRENA) 20 MCG/24HR IUD 1 each by Intrauterine route once.      Marland Kitchen levothyroxine (SYNTHROID, LEVOTHROID) 125 MCG tablet Take 1 tablet (125 mcg total) by mouth daily. 30 tablet 11   No current facility-administered medications for this visit.     OBJECTIVE: There were no vitals filed for this visit.   There is no height or weight on file to calculate BMI.    ECOG FS:0 - Asymptomatic  General: Well-developed, well-nourished, no acute distress. Eyes: Pink conjunctiva, anicteric sclera. HEENT: Normocephalic, moist mucous membranes, clear oropharnyx. Musculoskeletal: No edema, cyanosis, or clubbing. Neuro: Alert, answering all questions appropriately. Cranial nerves grossly intact. Skin: No rashes or petechiae noted. Psych: Normal affect.   LAB RESULTS:  Lab Results  Component Value Date   NA 137 05/07/2011   K 4.1 05/07/2011   CL 105 05/07/2011   CO2 25 05/07/2011   GLUCOSE  84 05/07/2011   BUN 16 05/07/2011   CREATININE 0.64 05/07/2011   CALCIUM 9.5 05/07/2011   PROT 7.2 05/07/2011   ALBUMIN 4.6 05/07/2011   AST 15 05/07/2011   ALT 15 05/07/2011   ALKPHOS 46 05/07/2011   BILITOT 0.5 05/07/2011    Lab Results  Component Value Date   WBC 6.7 05/07/2011   HGB 14.2 05/07/2011   HCT 43.0 05/07/2011   MCV 91.1 05/07/2011   PLT 190 05/07/2011     STUDIES: US Breast Ltd Uni Left Inc Axilla  Result Date:  01/04/2016 CLINICAL DATA:  Patient with prior history of palpable abnormality within the left breast. For short-term followup and re-evaluation of the left breast parenchymal tissue and focal asymmetry within the lower outer left breast. EXAM: 2D DIGITAL DIAGNOSTIC LEFT MAMMOGRAM WITH CAD AND ADJUNCT TOMO ULTRASOUND LEFT BREAST COMPARISON:  Previous exam(s). ACR Breast Density Category c: The breast tissue is heterogeneously dense, which may obscure small masses. FINDINGS: Stable left breast fibroglandular pattern. Stable asymmetry within the lower outer left breast. No concerning masses, calcifications or areas of architectural distortion identified within the left breast. Mammographic images were processed with CAD. On physical exam, I palpate dense tissue within the upper-outer left breast. Targeted ultrasound is performed, showing normal dense tissue without suspicious mass within the upper-outer left breast. IMPRESSION: No mammographic evidence for malignancy. Stable left breast asymmetry and fibroglandular pattern. RECOMMENDATION: Bilateral diagnostic mammography and possible left breast ultrasound in 6 months to demonstrate stability of left breast asymmetry. Given family history, recommend consideration of bilateral breast MRI. I have discussed the findings and recommendations with the patient. Results were also provided in writing at the conclusion of the visit. If applicable, a reminder letter will be sent to the patient regarding the next appointment. BI-RADS CATEGORY  3: Probably benign. Electronically Signed   By: Lovey Newcomer M.D.   On: 12/22/2015 12:24   Mm Diag Breast Tomo Uni Left  Result Date: 12/22/2015 CLINICAL DATA:  Patient with prior history of palpable abnormality within the left breast. For short-term followup and re-evaluation of the left breast parenchymal tissue and focal asymmetry within the lower outer left breast. EXAM: 2D DIGITAL DIAGNOSTIC LEFT MAMMOGRAM WITH CAD AND ADJUNCT TOMO  ULTRASOUND LEFT BREAST COMPARISON:  Previous exam(s). ACR Breast Density Category c: The breast tissue is heterogeneously dense, which may obscure small masses. FINDINGS: Stable left breast fibroglandular pattern. Stable asymmetry within the lower outer left breast. No concerning masses, calcifications or areas of architectural distortion identified within the left breast. Mammographic images were processed with CAD. On physical exam, I palpate dense tissue within the upper-outer left breast. Targeted ultrasound is performed, showing normal dense tissue without suspicious mass within the upper-outer left breast. IMPRESSION: No mammographic evidence for malignancy. Stable left breast asymmetry and fibroglandular pattern. RECOMMENDATION: Bilateral diagnostic mammography and possible left breast ultrasound in 6 months to demonstrate stability of left breast asymmetry. Given family history, recommend consideration of bilateral breast MRI. I have discussed the findings and recommendations with the patient. Results were also provided in writing at the conclusion of the visit. If applicable, a reminder letter will be sent to the patient regarding the next appointment. BI-RADS CATEGORY  3: Probably benign. Electronically Signed   By: Lovey Newcomer M.D.   On: 12/22/2015 12:24    ASSESSMENT: Genetic screening and testing  PLAN:    1. Genetic screening and testing: Patient has no personal history of malignancy, but she has a maternal grandmother who had  colon cancer in her 37s and breast cancer in her 25s. A maternal aunt had breast cancer in her 89s. Patient's mother also had melanoma and her 19s. Given her significant family history, have recommended genetic testing using the [facility] panel. If genetic testing is negative, patient has been educated on screening measures she can take since she is at increased risk for malignancy over the general population. If her genetic test is positive, she will return to clinic for  further evaluation to discuss prophylactic measures she can take. Patient has 2 children under the age of 18, therefore genetic testing is not recommended for them.   Approximate 45 minutes was spent in discussion of which greater than 50% was consultation.  Patient expressed understanding and was in agreement with this plan. She also understands that She can call clinic at any time with any questions, concerns, or complaints.   Lloyd Huger, MD   01/14/2016 6:18 PM

## 2016-01-15 ENCOUNTER — Encounter: Payer: Self-pay | Admitting: Oncology

## 2016-01-15 ENCOUNTER — Inpatient Hospital Stay: Payer: 59

## 2016-01-15 ENCOUNTER — Inpatient Hospital Stay: Payer: 59 | Attending: Oncology | Admitting: Oncology

## 2016-01-15 DIAGNOSIS — E0500 Thyrotoxicosis with diffuse goiter without thyrotoxic crisis or storm: Secondary | ICD-10-CM | POA: Diagnosis not present

## 2016-01-15 DIAGNOSIS — N6489 Other specified disorders of breast: Secondary | ICD-10-CM | POA: Insufficient documentation

## 2016-01-15 DIAGNOSIS — Z800 Family history of malignant neoplasm of digestive organs: Secondary | ICD-10-CM | POA: Diagnosis not present

## 2016-01-15 DIAGNOSIS — F17210 Nicotine dependence, cigarettes, uncomplicated: Secondary | ICD-10-CM | POA: Insufficient documentation

## 2016-01-15 DIAGNOSIS — F419 Anxiety disorder, unspecified: Secondary | ICD-10-CM | POA: Diagnosis not present

## 2016-01-15 DIAGNOSIS — Z1379 Encounter for other screening for genetic and chromosomal anomalies: Secondary | ICD-10-CM | POA: Diagnosis present

## 2016-01-15 DIAGNOSIS — Z808 Family history of malignant neoplasm of other organs or systems: Secondary | ICD-10-CM

## 2016-01-15 DIAGNOSIS — Z803 Family history of malignant neoplasm of breast: Secondary | ICD-10-CM | POA: Diagnosis not present

## 2016-01-15 NOTE — Progress Notes (Signed)
New evaluation for genetics counseling. Offers no complaints.

## 2016-07-02 ENCOUNTER — Other Ambulatory Visit: Payer: Self-pay | Admitting: Specialist

## 2016-07-02 DIAGNOSIS — S838X2A Sprain of other specified parts of left knee, initial encounter: Secondary | ICD-10-CM

## 2016-07-03 ENCOUNTER — Ambulatory Visit: Payer: Self-pay | Admitting: Psychiatry

## 2016-07-04 ENCOUNTER — Encounter: Payer: Self-pay | Admitting: Psychiatry

## 2016-07-04 ENCOUNTER — Ambulatory Visit (INDEPENDENT_AMBULATORY_CARE_PROVIDER_SITE_OTHER): Payer: 59 | Admitting: Psychiatry

## 2016-07-04 VITALS — BP 107/73 | HR 88 | Temp 98.6°F | Wt 169.2 lb

## 2016-07-04 DIAGNOSIS — F411 Generalized anxiety disorder: Secondary | ICD-10-CM

## 2016-07-04 MED ORDER — BUPROPION HCL ER (XL) 300 MG PO TB24: 300.0000 mg | ORAL_TABLET | Freq: Every day | ORAL | 1 refills | Status: DC
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
## 2016-07-15 ENCOUNTER — Telehealth: Payer: Self-pay | Admitting: *Deleted

## 2016-07-15 ENCOUNTER — Ambulatory Visit: Payer: 59

## 2016-07-24 ENCOUNTER — Ambulatory Visit: Admission: RE | Admit: 2016-07-24 | Payer: 59 | Source: Ambulatory Visit

## 2017-01-20 ENCOUNTER — Emergency Department: Payer: 59

## 2017-01-20 ENCOUNTER — Emergency Department
Admission: EM | Admit: 2017-01-20 | Discharge: 2017-01-20 | Disposition: A | Discharge status: Home / Self Care | Payer: 59 | Attending: Emergency Medicine | Admitting: Emergency Medicine

## 2017-01-20 ENCOUNTER — Encounter: Payer: Self-pay | Admitting: Radiology

## 2017-01-20 DIAGNOSIS — E039 Hypothyroidism, unspecified: Secondary | ICD-10-CM | POA: Diagnosis not present

## 2017-01-20 DIAGNOSIS — F17210 Nicotine dependence, cigarettes, uncomplicated: Secondary | ICD-10-CM | POA: Diagnosis not present

## 2017-01-20 DIAGNOSIS — R200 Anesthesia of skin: Secondary | ICD-10-CM | POA: Diagnosis not present

## 2017-01-20 DIAGNOSIS — R42 Dizziness and giddiness: Secondary | ICD-10-CM | POA: Insufficient documentation

## 2017-01-20 DIAGNOSIS — F410 Panic disorder [episodic paroxysmal anxiety] without agoraphobia: Secondary | ICD-10-CM | POA: Insufficient documentation

## 2017-01-20 LAB — URINALYSIS, ROUTINE W REFLEX MICROSCOPIC
Bilirubin Urine: NEGATIVE
Glucose, UA: NEGATIVE mg/dL
Hgb urine dipstick: NEGATIVE
Ketones, ur: NEGATIVE mg/dL
Leukocytes, UA: NEGATIVE
NITRITE: NEGATIVE
PH: 8 (ref 5.0–8.0)
Protein, ur: NEGATIVE mg/dL
Specific Gravity, Urine: >1.046 — ABNORMAL HIGH (ref 1.005–1.030)

## 2017-01-20 LAB — DIFFERENTIAL
Basophils Absolute: 0.1 10*3/uL (ref 0–0.1)
Basophils Relative: 1 %
Eosinophils Absolute: 0.3 10*3/uL (ref 0–0.7)
Eosinophils Relative: 4 %
LYMPHS PCT: 32 %
Lymphs Abs: 2.5 10*3/uL (ref 1.0–3.6)
MONO ABS: 0.4 10*3/uL (ref 0.2–0.9)
MONOS PCT: 5 %
NEUTROS ABS: 4.6 10*3/uL (ref 1.4–6.5)
Neutrophils Relative %: 58 %

## 2017-01-20 LAB — URINE DRUG SCREEN, QUALITATIVE (ARMC ONLY)
Amphetamines, Ur Screen: NOT DETECTED
BARBITURATES, UR SCREEN: NOT DETECTED
Benzodiazepine, Ur Scrn: NOT DETECTED
CANNABINOID 50 NG, UR ~~LOC~~: NOT DETECTED
COCAINE METABOLITE, UR ~~LOC~~: NOT DETECTED
MDMA (Ecstasy)Ur Screen: NOT DETECTED
Methadone Scn, Ur: NOT DETECTED
Opiate, Ur Screen: NOT DETECTED
PHENCYCLIDINE (PCP) UR S: NOT DETECTED
TRICYCLIC, UR SCREEN: NOT DETECTED

## 2017-01-20 LAB — PROTIME-INR
INR: 1.03
Prothrombin Time: 13.4 seconds (ref 11.4–15.2)

## 2017-01-20 LAB — APTT: aPTT: 28 seconds (ref 24–36)

## 2017-01-20 LAB — CBC
HEMATOCRIT: 42.3 % (ref 35.0–47.0)
HEMOGLOBIN: 14.5 g/dL (ref 12.0–16.0)
MCH: 31.2 pg (ref 26.0–34.0)
MCHC: 34.2 g/dL (ref 32.0–36.0)
MCV: 91.3 fL (ref 80.0–100.0)
Platelets: 211 10*3/uL (ref 150–440)
RBC: 4.64 MIL/uL (ref 3.80–5.20)
RDW: 13.4 % (ref 11.5–14.5)
WBC: 7.8 10*3/uL (ref 3.6–11.0)

## 2017-01-20 LAB — COMPREHENSIVE METABOLIC PANEL
ALK PHOS: 55 U/L (ref 38–126)
ALT: 10 U/L — AB (ref 14–54)
AST: 19 U/L (ref 15–41)
Albumin: 4.7 g/dL (ref 3.5–5.0)
Anion gap: 9 (ref 5–15)
BUN: 15 mg/dL (ref 6–20)
CO2: 22 mmol/L (ref 22–32)
CREATININE: 0.75 mg/dL (ref 0.44–1.00)
Calcium: 9.6 mg/dL (ref 8.9–10.3)
Chloride: 105 mmol/L (ref 101–111)
GFR calc non Af Amer: >60 mL/min (ref >60)
Glucose, Bld: 94 mg/dL (ref 65–99)
Potassium: 4 mmol/L (ref 3.5–5.1)
Sodium: 136 mmol/L (ref 135–145)
Total Bilirubin: 0.6 mg/dL (ref 0.3–1.2)
Total Protein: 7.9 g/dL (ref 6.5–8.1)

## 2017-01-20 LAB — GLUCOSE, CAPILLARY: GLUCOSE-CAPILLARY: 103 mg/dL — AB (ref 65–99)

## 2017-01-20 LAB — POCT PREGNANCY, URINE: PREG TEST UR: NEGATIVE

## 2017-01-20 LAB — ETHANOL: Alcohol, Ethyl (B): <10 mg/dL (ref <10)

## 2017-01-20 LAB — TROPONIN I

## 2017-01-20 MED ORDER — MECLIZINE HCL 25 MG PO TABS: 25.0000 mg | ORAL_TABLET | Freq: Three times a day (TID) | ORAL | 1 refills | Status: DC | PRN

## 2017-01-20 MED ORDER — DIAZEPAM 5 MG PO TABS: 5.0000 mg | ORAL_TABLET | Freq: Three times a day (TID) | ORAL | 0 refills | Status: DC | PRN

## 2017-01-20 MED ORDER — LORAZEPAM 2 MG/ML IJ SOLN
1.0000 mg | Freq: Once | INTRAMUSCULAR | Status: AC
  Administered 2017-01-20: 1 mg via INTRAVENOUS
  Filled 2017-01-20: qty 1

## 2017-01-20 MED ORDER — IOPAMIDOL (ISOVUE-370) INJECTION 76%
75.0000 mL | Freq: Once | INTRAVENOUS | Status: AC | PRN
  Administered 2017-01-20: 75 mL via INTRAVENOUS

## 2017-01-20 NOTE — Consult Note (Signed)
Referring Physician: Jimmye Norman    Chief Complaint: Dizziness, right facial numbness  HPI: Latasha Singleton is an 36 y.o. female with a history of anxiety who presents with above complaints.  Patient initially unable to give history due to nausea so history obtained from father.  Patient was at work vacuuming and had acute onset of dizziness that she describes as vertigo.  Dizziness was so severe that she fell onto a wall and slid to the ground.  Began to yell and co-workers came to assist her.  She was brought to the ED where she remains dizzy and numb on the face.  Initial NIHSS of 1.   Patient refusing all medications.    Date last known well: Date: 01/20/2017 Time last known well: Time: 10:00 tPA Given: No: not felt to be a stroke  Past Medical History:  Diagnosis Date  . Anxiety   . Grave's disease   . Hypothyroidism   . Kidney stones   . Nephrolithiasis   . Thyroid cancer ([place])     No past surgical history on file.  Family History  Problem Relation Age of Onset  . Diabetes Father        TYPE 2  . Hepatitis Father   . Liver disease Father   . Hyperlipidemia Mother   . Anxiety disorder Mother   . Melanoma Mother 75  . Stroke Paternal Grandmother   . Heart disease Paternal Grandmother        heart attack.  Marland Kitchen Heart disease Paternal Grandfather        heart attack  . Breast cancer Maternal Aunt 67  . Breast cancer Maternal Grandmother 60  . Colon cancer Maternal Grandmother 35  . Anxiety disorder Sister   . Anxiety disorder Sister    Social History:  reports that she has been smoking cigarettes.  She has been smoking about 1.00 pack per day. she has never used smokeless tobacco. She reports that she does not drink alcohol or use drugs.  Allergies: No Known Allergies  Medications: I have reviewed the patient's current medications. Prior to Admission:  Prior to Admission medications   Medication Sig Start Date End Date Taking? Authorizing Provider  buPROPion (WELLBUTRIN  XL) 150 MG 24 hr tablet Take 150 mg 2 (two) times daily by mouth. 12/14/16  Yes [provider]  levothyroxine (SYNTHROID, LEVOTHROID) 112 MCG tablet Take 112 mcg daily by mouth. 12/30/16  Yes [provider]  buPROPion (WELLBUTRIN XL) 300 MG 24 hr tablet Take 1 tablet (300 mg total) by mouth daily. Patient not taking: Reported on 01/20/2017 07/04/16   Elvin So, MD    ROS: History obtained from the patient  General ROS: negative for - chills, fatigue, fever, night sweats, weight gain or weight loss Psychological ROS: anxious Ophthalmic ROS: negative for - blurry vision, double vision, eye pain or loss of vision ENT ROS: negative for - epistaxis, nasal discharge, oral lesions, sore throat, tinnitus or vertigo Allergy and Immunology ROS: negative for - hives or itchy/watery eyes Hematological and Lymphatic ROS: negative for - bleeding problems, bruising or swollen lymph nodes Endocrine ROS: negative for - galactorrhea, hair pattern changes, polydipsia/polyuria or temperature intolerance Respiratory ROS: negative for - cough, hemoptysis, shortness of breath or wheezing Cardiovascular ROS: negative for - chest pain, dyspnea on exertion, edema or irregular heartbeat Gastrointestinal ROS: negative for - abdominal pain, diarrhea, hematemesis, nausea/vomiting or stool incontinence Genito-Urinary ROS: negative for - dysuria, hematuria, incontinence or urinary frequency/urgency Musculoskeletal ROS: neck pain Neurological  ROS: as noted in HPI Dermatological ROS: negative for rash and skin lesion changes  Physical Examination: Blood pressure 114/88, pulse 82, temperature 98.3 F (36.8 C), temperature source Oral, resp. rate 19, SpO2 99 %.  HEENT-  Normocephalic, no lesions, without obvious abnormality.  Normal external eye and conjunctiva.  Normal TM's bilaterally.  Normal auditory canals and external ears. Normal external nose, mucus membranes and septum.  Normal  pharynx. Cardiovascular- S1, S2 normal, pulses palpable throughout   Lungs- chest clear, no wheezing, rales, normal symmetric air entry Abdomen- soft, non-tender; bowel sounds normal; no masses,  no organomegaly Extremities- no edema Lymph-no adenopathy palpable Musculoskeletal-no joint tenderness, deformity or swelling Skin-warm and dry, no hyperpigmentation, vitiligo, or suspicious lesions  Neurological Examination  (limiting cooperation reporting that movement including wiggling toes caused either severe neck pain or worsening dizziness) Mental Status: Alert, oriented, thought content appropriate.  Speech fluent without evidence of aphasia.  Able to follow 3 step commands without difficulty. Cranial Nerves: II: Discs flat bilaterally; Visual fields grossly normal, pupils equal, round, reactive to light and accommodation III,IV, VI: ptosis not present, extra-ocular motions intact bilaterally V,VII: smile symmetric, facial light touch sensation decreased on the right VIII: hearing normal bilaterally IX,X: gag reflex present XI: bilateral shoulder shrug XII: midline tongue extension Motor: Right : Upper extremity   5/5    Left:     Upper extremity   5/5  Lower extremity   5/5     Lower extremity   5/5 Tone and bulk:normal tone throughout; no atrophy noted Sensory: Pinprick and light touch decreased in the RUE Deep Tendon Reflexes: 3+ and symmetric throughout Plantars: Right: downgoing   Left: downgoing Cerebellar: Unable to perform Gait: not tested due to safety concerns and patient cooperation    Laboratory Studies:  Basic Metabolic Panel: Recent Labs  Lab 01/20/17 1102  NA 136  K 4.0  CL 105  CO2 22  GLUCOSE 94  BUN 15  CREATININE 0.75  CALCIUM 9.6    Liver Function Tests: Recent Labs  Lab 01/20/17 1102  AST 19  ALT 10*  ALKPHOS 55  BILITOT 0.6  PROT 7.9  ALBUMIN 4.7   No results for input(s): LIPASE, AMYLASE in the last 168 hours. No results for  input(s): AMMONIA in the last 168 hours.  CBC: Recent Labs  Lab 01/20/17 1102  WBC 7.8  NEUTROABS 4.6  HGB 14.5  HCT 42.3  MCV 91.3  PLT 211    Cardiac Enzymes: Recent Labs  Lab 01/20/17 1102  TROPONINI <0.03    BNP: Invalid input(s): POCBNP  CBG: Recent Labs  Lab 01/20/17 [place]*    Microbiology: No results found for this or any previous visit.  Coagulation Studies: Recent Labs    01/20/17 1102  LABPROT 13.4  INR 1.03    Urinalysis: No results for input(s): COLORURINE, LABSPEC, PHURINE, GLUCOSEU, HGBUR, BILIRUBINUR, KETONESUR, PROTEINUR, UROBILINOGEN, NITRITE, LEUKOCYTESUR in the last 168 hours.  Invalid input(s): APPERANCEUR  Lipid Panel:    Component Value Date/Time   CHOL 175 05/07/2011 0815   TRIG 50 05/07/2011 0815   HDL 59 05/07/2011 0815   CHOLHDL 3.0 05/07/2011 0815   VLDL 10 05/07/2011 0815   LDLCALC 106 (H) 05/07/2011 0815    HgbA1C: No results found for: HGBA1C  Urine Drug Screen:  No results found for: LABOPIA, COCAINSCRNUR, LABBENZ, AMPHETMU, THCU, LABBARB  Alcohol Level:  Recent Labs  Lab 01/20/17 [place] <10    Imaging: Ct Cervical Spine  Wo Contrast  Result Date: 01/20/2017 CLINICAL DATA:  Code stroke. Sudden onset right facial numbness and dizziness. Fell and hit head on wall. Initial encounter. EXAM: CT HEAD WITHOUT CONTRAST CT CERVICAL SPINE WITHOUT CONTRAST TECHNIQUE: Multidetector CT imaging of the head and cervical spine was performed following the standard protocol without intravenous contrast. Multiplanar CT image reconstructions of the cervical spine were also generated. COMPARISON:  Cervical spine radiographs 06/25/2005 FINDINGS: CT HEAD FINDINGS Brain: There is no evidence of acute infarct, intracranial hemorrhage, mass, midline shift, or extra-axial fluid collection. The ventricles and sulci are normal. Vascular: No hyperdense vessel. Skull: No fracture or focal osseous lesion. Sinuses/Orbits: Visualized  paranasal sinuses and mastoid air cells are clear. Visualized orbits are unremarkable. Other: None. ASPECTS ([place] Stroke Program Early CT Score) - Ganglionic level infarction (caudate, lentiform nuclei, internal capsule, insula, M1-M3 cortex): 7 - Supraganglionic infarction (M4-M6 cortex): 3 Total score (0-10 with 10 being normal): 10 CT CERVICAL SPINE FINDINGS Alignment: Straightening/slight reversal of the normal cervical lordosis. No subluxation. Skull base and vertebrae: No acute fracture or destructive osseous process. Soft tissues and spinal canal: No prevertebral fluid or swelling. No visible canal hematoma. Disc levels:  Unremarkable. Upper chest: Minimal paraseptal emphysema. Other: None. IMPRESSION: 1. Negative head CT. No evidence of acute infarct or traumatic intracranial injury. ASPECTS of 10. 2. No evidence of acute cervical spine fracture or traumatic subluxation. These results were called by telephone at the time of interpretation on 01/20/2017 at 11:11 am to Dr. Merlyn Lot , who verbally acknowledged these results. Electronically Signed   By: Logan Bores M.D.   On: 01/20/2017 11:14   Ct Head Code Stroke Wo Contrast  Result Date: 01/20/2017 CLINICAL DATA:  Code stroke. Sudden onset right facial numbness and dizziness. Fell and hit head on wall. Initial encounter. EXAM: CT HEAD WITHOUT CONTRAST CT CERVICAL SPINE WITHOUT CONTRAST TECHNIQUE: Multidetector CT imaging of the head and cervical spine was performed following the standard protocol without intravenous contrast. Multiplanar CT image reconstructions of the cervical spine were also generated. COMPARISON:  Cervical spine radiographs 06/25/2005 FINDINGS: CT HEAD FINDINGS Brain: There is no evidence of acute infarct, intracranial hemorrhage, mass, midline shift, or extra-axial fluid collection. The ventricles and sulci are normal. Vascular: No hyperdense vessel. Skull: No fracture or focal osseous lesion. Sinuses/Orbits: Visualized  paranasal sinuses and mastoid air cells are clear. Visualized orbits are unremarkable. Other: None. ASPECTS ([place] Stroke Program Early CT Score) - Ganglionic level infarction (caudate, lentiform nuclei, internal capsule, insula, M1-M3 cortex): 7 - Supraganglionic infarction (M4-M6 cortex): 3 Total score (0-10 with 10 being normal): 10 CT CERVICAL SPINE FINDINGS Alignment: Straightening/slight reversal of the normal cervical lordosis. No subluxation. Skull base and vertebrae: No acute fracture or destructive osseous process. Soft tissues and spinal canal: No prevertebral fluid or swelling. No visible canal hematoma. Disc levels:  Unremarkable. Upper chest: Minimal paraseptal emphysema. Other: None. IMPRESSION: 1. Negative head CT. No evidence of acute infarct or traumatic intracranial injury. ASPECTS of 10. 2. No evidence of acute cervical spine fracture or traumatic subluxation. These results were called by telephone at the time of interpretation on 01/20/2017 at 11:11 am to Dr. Merlyn Lot , who verbally acknowledged these results. Electronically Signed   By: Logan Bores M.D.   On: 01/20/2017 11:14    Assessment: 36 y.o. female presenting with dizziness and right sided numbness.  Head CT performed and shows no acute changes.  Symptoms persist in the ED therefore further work up recommended.  Symptoms  minimal at this time and would not indicate need for tPA.    Stroke Risk Factors - smoking  Plan: 1. CTA of the head and neck.   2. ASA 325mg   3. Telemetry monitoring 4. Frequent neuro checks   Case discussed with Dr. Simon Rhein, MD Neurology [phone] 01/20/2017, 12:11 PM   Addendum: CTA reviewed and shows no evidence of large vessel occlusion.  Right sided symptoms persist although dizziness has improved.    Recommendations: 1.  MRI of the brain without contrast  Alexis Goodell, MD Neurology [phone]  Addendum: MRI of the brain unremarkable.  No further  neurological work up recommended at this time.  Patient to follow up with her physician on an outpatient basis.    Alexis Goodell, MD Neurology [phone]

## 2017-01-20 NOTE — ED Notes (Signed)
Resumed care from [facility] rn  Pt alert.  nsr on monitor.  Speech clear  Family at bedside.

## 2017-01-20 NOTE — ED Notes (Signed)
Patient transported to CT 

## 2017-01-20 NOTE — ED Triage Notes (Signed)
Pt had a sudden onset of right facial numbness with dizziness starting at 1000 today

## 2017-01-20 NOTE — ED Provider Notes (Signed)
[facility] Emergency Department Provider Note       Time seen: ----------------------------------------- 11:00 AM on 01/20/2017 -----------------------------------------   I have reviewed the triage vital signs and the nursing notes.  HISTORY   Chief Complaint Code Stroke    HPI Latasha Singleton is a 36 y.o. female with a history of anxiety, Graves' disease who presents to the ED for sudden onset of right facial numbness with dizziness at about 10 AM today.  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  This was reportedly worsening to the point where EMS was called.  She was also having some right-sided facial numbness appearing the same time.  She reports she has experienced vertigo in the past but never symptoms this severe.  She denies any recent illness reports she took her medication today as prescribed.  Past Medical History:  Diagnosis Date  . Anxiety   . Grave's disease   . Hypothyroidism   . Kidney stones   . Nephrolithiasis   . Thyroid cancer [facility])     Patient Active Problem List   Diagnosis Date Noted  . Genetic screening 01/14/2016  . Anxiety 12/04/2010  . Grave's disease 12/04/2010  . Hypothyroid 12/04/2010  . Nephrolithiasis     No past surgical history on file.  Allergies Patient has no known allergies.  Social History Social History   Tobacco Use  . Smoking status: Current Every Day Smoker    Packs/day: 1.00    Types: Cigarettes  . Smokeless tobacco: Never Used  Substance Use Topics  . Alcohol use: No    Comment: occassionally  . Drug use: No    Review of Systems Constitutional: Negative for fever. Eyes: Negative for vision changes ENT:  Negative for congestion, sore throat Cardiovascular: Negative for chest pain. Respiratory: Negative for shortness of breath. Gastrointestinal: Negative for abdominal pain, positive for  nausea Genitourinary: Negative for dysuria. Musculoskeletal: Negative for back pain. Skin: Negative for rash. Neurological: Positive for facial numbness, vertigo  All systems negative/normal/unremarkable except as stated in the HPI  ____________________________________________   PHYSICAL EXAM:  VITAL SIGNS: ED Triage Vitals [01/20/17 1046]  Enc Vitals Group     BP 104/69     Pulse Rate 89     Resp 20     Temp 98.3 F (36.8 C)     Temp Source Oral     SpO2 100 %     Weight      Height      Head Circumference      Peak Flow      Pain Score      Pain Loc      Pain Edu?      Excl. in [place]?     Constitutional: Alert and oriented.  Anxious, moderate distress Eyes: Conjunctivae are normal. Normal extraocular movements. ENT   Head: Normocephalic and atraumatic.   Nose: No congestion/rhinnorhea.   Mouth/Throat: Mucous membranes are moist.   Neck: No stridor. Cardiovascular: Normal rate, regular rhythm. No murmurs, rubs, or gallops. Respiratory: Normal respiratory effort without tachypnea nor retractions. Breath sounds are clear and equal bilaterally. No wheezes/rales/rhonchi. Gastrointestinal: Soft and nontender. Normal bowel sounds Musculoskeletal: Nontender with normal range of motion in extremities. No lower extremity tenderness nor edema. Neurologic:  Normal speech and language. No gross focal neurologic deficits are appreciated.  Patient describes vertiginous symptoms.  Strength, sensation, cranial nerves appear to be normal.  She  describes ataxia.  The pain seems to be elicited in her neck with range of motion of her arms or legs Skin:  Skin is warm, dry and intact. No rash noted. Psychiatric: anxious mood and affect ____________________________________________  ED COURSE:  Pertinent labs & imaging results that were available during my care of the patient were reviewed by me and considered in my medical decision making (see chart for details). Patient  presents for vertigo as well as numbness, we will assess with labs and imaging as indicated.   Procedures ____________________________________________   LABS (pertinent positives/negatives)  Labs Reviewed  COMPREHENSIVE METABOLIC PANEL - Abnormal; Notable for the following components:      Result Value   ALT 10 (*)    All other components within normal limits  URINALYSIS, ROUTINE W REFLEX MICROSCOPIC - Abnormal; Notable for the following components:   Color, Urine YELLOW (*)    APPearance CLEAR (*)    Specific Gravity, Urine >1.046 (*)    All other components within normal limits  GLUCOSE, CAPILLARY - Abnormal; Notable for the following components:   Glucose-Capillary 103 (*)    All other components within normal limits  ETHANOL  PROTIME-INR  APTT  CBC  DIFFERENTIAL  TROPONIN I  URINE DRUG SCREEN, QUALITATIVE ([facility] ONLY)  POC URINE PREG, ED   CRITICAL CARE Performed by: Earleen Newport   Total critical care time: 30 minutes  Critical care time was exclusive of separately billable procedures and treating other patients.  Critical care was necessary to treat or prevent imminent or life-threatening deterioration.  Critical care was time spent personally by me on the following activities: development of treatment plan with patient and/or surrogate as well as nursing, discussions with consultants, evaluation of patient's response to treatment, examination of patient, obtaining history from patient or surrogate, ordering and performing treatments and interventions, ordering and review of laboratory studies, ordering and review of radiographic studies, pulse oximetry and re-evaluation of patient's condition.   RADIOLOGY Images were viewed by me  CT head neck was initially negative CT angiogram of the head neck IMPRESSION: 1. Negative CTA head and neck. 2. Stable and negative CT appearance of the brain. MRI IMPRESSION: Negative MRI head without contrast.  Mild  mucosal edema paranasal sinuses ____________________________________________  DIFFERENTIAL DIAGNOSIS   Anxiety, torticollis, vertigo, CVA, dissection  FINAL ASSESSMENT AND PLAN  Vertigo, anxiety   Plan: Patient had presented for numbness as well as dizziness. Patient's labs were negative. Patient's imaging was negative including CT, CT angiogram and MRI.  Patient was seen by neurologist in the ER as well.  She appears to have a combination of peripheral vertigo and panic attack.  She will be discharged with meclizine and Valium and is stable for outpatient follow-up with her doctor.   Earleen Newport, MD   Note: This note was generated in part or whole with voice recognition software. Voice recognition is usually quite accurate but there are transcription errors that can and very often do occur. I apologize for any typographical errors that were not detected and corrected.     Earleen Newport, MD 01/20/17 [phone]

## 2018-04-10 ENCOUNTER — Ambulatory Visit
Admission: EM | Admit: 2018-04-10 | Discharge: 2018-04-10 | Disposition: A | Discharge status: Home / Self Care | Payer: 59 | Attending: Family Medicine | Admitting: Family Medicine

## 2018-04-10 ENCOUNTER — Encounter: Payer: Self-pay | Admitting: Emergency Medicine

## 2018-04-10 ENCOUNTER — Other Ambulatory Visit: Payer: Self-pay

## 2018-04-10 DIAGNOSIS — F17210 Nicotine dependence, cigarettes, uncomplicated: Secondary | ICD-10-CM

## 2018-04-10 DIAGNOSIS — J0100 Acute maxillary sinusitis, unspecified: Secondary | ICD-10-CM

## 2018-04-10 DIAGNOSIS — H6503 Acute serous otitis media, bilateral: Secondary | ICD-10-CM | POA: Diagnosis not present

## 2018-04-10 MED ORDER — AMOXICILLIN 875 MG PO TABS: 875.0000 mg | ORAL_TABLET | Freq: Two times a day (BID) | ORAL | 0 refills | Status: DC

## 2018-04-10 NOTE — ED Triage Notes (Signed)
Patient c/o sinus congestion and HAs since Monday.  Patient states that she previously had the flu.

## 2018-04-10 NOTE — ED Provider Notes (Signed)
[facility]    CSN: [id number] Arrival date & time: 04/10/18  1425     History   Chief Complaint Chief Complaint  Patient presents with  . Sinus Problem  . Headache    HPI Journey L Latasha Singleton is a 38 y.o. female.   The history is provided by the patient.  URI  Presenting symptoms: congestion, cough, ear pain, facial pain and fatigue   Severity:  Moderate Onset quality:  Sudden Duration:  6 days Timing:  Constant Progression:  Worsening Chronicity:  New Relieved by:  Nothing Ineffective treatments:  OTC medications Associated symptoms: sinus pain   Associated symptoms: no wheezing   Risk factors: sick contacts     Past Medical History:  Diagnosis Date  . Anxiety   . Grave's disease   . Hypothyroidism   . Kidney stones   . Nephrolithiasis   . Thyroid cancer [facility])     Patient Active Problem List   Diagnosis Date Noted  . Genetic screening 01/14/2016  . Anxiety 12/04/2010  . Grave's disease 12/04/2010  . Hypothyroid 12/04/2010  . Nephrolithiasis     History reviewed. No pertinent surgical history.  OB History    Gravida  2   Para  2   Term      Preterm      AB      Living        SAB      TAB      Ectopic      Multiple      Live Births               Home Medications    Prior to Admission medications   Medication Sig Start Date End Date Taking? Authorizing Provider  clonazePAM (KLONOPIN) 0.5 MG tablet Take by mouth. 03/20/18  Yes [provider]  levonorgestrel (MIRENA) 20 MCG/24HR IUD by Intrauterine route.   Yes [provider]  levothyroxine (SYNTHROID, LEVOTHROID) 112 MCG tablet Take 112 mcg daily by mouth. 12/30/16  Yes [provider]  amoxicillin (AMOXIL) 875 MG tablet Take 1 tablet (875 mg total) by mouth 2 (two) times daily. 04/10/18   Norval Gable, MD  buPROPion (WELLBUTRIN XL) 150 MG 24 hr tablet Take 150 mg 2 (two) times daily by mouth. 12/14/16   [provider]  buPROPion  (WELLBUTRIN XL) 300 MG 24 hr tablet Take 1 tablet (300 mg total) by mouth daily. Patient not taking: Reported on 01/20/2017 07/04/16   Elvin So, MD  diazepam (VALIUM) 5 MG tablet Take 1 tablet (5 mg total) every 8 (eight) hours as needed by mouth (take with meclizine as needed for vertigo or for anxiety). 01/20/17   Earleen Newport, MD  meclizine (ANTIVERT) 25 MG tablet Take 1 tablet (25 mg total) 3 (three) times daily as needed by mouth for dizziness or nausea. 01/20/17   Earleen Newport, MD    Family History Family History  Problem Relation Age of Onset  . Diabetes Father        TYPE 2  . Hepatitis Father   . Liver disease Father   . Hyperlipidemia Mother   . Anxiety disorder Mother   . Melanoma Mother 38  . Stroke Paternal Grandmother   . Heart disease Paternal Grandmother        heart attack.  Marland Kitchen Heart disease Paternal Grandfather        heart attack  . Breast cancer Maternal Aunt 67  . Breast cancer Maternal Grandmother 60  .  Colon cancer Maternal Grandmother 33  . Anxiety disorder Sister   . Anxiety disorder Sister     Social History Social History   Tobacco Use  . Smoking status: Current Every Day Smoker    Packs/day: 1.00    Types: Cigarettes  . Smokeless tobacco: Never Used  Substance Use Topics  . Alcohol use: No    Comment: occassionally  . Drug use: No     Allergies   Patient has no known allergies.   Review of Systems Review of Systems  Constitutional: Positive for fatigue.  HENT: Positive for congestion, ear pain and sinus pain.   Respiratory: Positive for cough. Negative for wheezing.      Physical Exam Triage Vital Signs ED Triage Vitals  Enc Vitals Group     BP 04/10/18 1555 118/81     Pulse Rate 04/10/18 1555 89     Resp 04/10/18 1555 16     Temp 04/10/18 1555 98.1 F (36.7 C)     Temp Source 04/10/18 1555 Oral     SpO2 04/10/18 1555 100 %     Weight 04/10/18 1553 152 lb (68.9 kg)     Height 04/10/18 1553 5\' 5"  (1.651  m)     Head Circumference --      Peak Flow --      Pain Score 04/10/18 1553 10     Pain Loc --      Pain Edu? --      Excl. in [place]? --    No data found.  Updated Vital Signs BP 118/81 (BP Location: Left Arm)   Pulse 89   Temp 98.1 F (36.7 C) (Oral)   Resp 16   Ht 5\' 5"  (1.651 m)   Wt 68.9 kg   SpO2 100%   BMI 25.29 kg/m   Visual Acuity Right Eye Distance:   Left Eye Distance:   Bilateral Distance:    Right Eye Near:   Left Eye Near:    Bilateral Near:     Physical Exam Vitals signs and nursing note reviewed.  Constitutional:      General: She is not in acute distress.    Appearance: She is well-developed. She is not toxic-appearing or diaphoretic.  HENT:     Head: Normocephalic and atraumatic.     Right Ear: Ear canal and external ear normal. A middle ear effusion is present. Tympanic membrane is erythematous and bulging.     Left Ear: Ear canal and external ear normal. A middle ear effusion is present. Tympanic membrane is erythematous and bulging.     Nose: Mucosal edema and rhinorrhea present. No nasal deformity, septal deviation or laceration.     Right Sinus: Maxillary sinus tenderness and frontal sinus tenderness present.     Left Sinus: Maxillary sinus tenderness and frontal sinus tenderness present.     Mouth/Throat:     Mouth: Mucous membranes are moist.     Pharynx: Uvula midline. Posterior oropharyngeal erythema present. No oropharyngeal exudate.  Neck:     Musculoskeletal: Normal range of motion and neck supple.     Thyroid: No thyromegaly.  Cardiovascular:     Rate and Rhythm: Normal rate and regular rhythm.     Heart sounds: Normal heart sounds.  Pulmonary:     Effort: Pulmonary effort is normal. No respiratory distress.     Breath sounds: No stridor. No wheezing, rhonchi or rales.  Lymphadenopathy:     Cervical: No cervical adenopathy.  Neurological:  Mental Status: She is alert.      UC Treatments / Results  Labs (all labs ordered  are listed, but only abnormal results are displayed) Labs Reviewed - No data to display  EKG None  Radiology No results found.  Procedures Procedures (including critical care time)  Medications Ordered in UC Medications - No data to display  Initial Impression / Assessment and Plan / UC Course  I have reviewed the triage vital signs and the nursing notes.  Pertinent labs & imaging results that were available during my care of the patient were reviewed by me and considered in my medical decision making (see chart for details).      Final Clinical Impressions(s) / UC Diagnoses   Final diagnoses:  Acute maxillary sinusitis, recurrence not specified  Bilateral acute serous otitis media, recurrence not specified     Discharge Instructions     Flonase nasal spray    ED Prescriptions    Medication Sig Dispense Auth. Provider   amoxicillin (AMOXIL) 875 MG tablet Take 1 tablet (875 mg total) by mouth 2 (two) times daily. 20 tablet Norval Gable, MD      1. diagnosis reviewed with patient 2. rx as per orders above; reviewed possible side effects, interactions, risks and benefits  3. Recommend supportive treatment with rest, fluids, otc analgesics prn  4. Follow-up prn if symptoms worsen or don't improve   Controlled Substance Prescriptions [place] Controlled Substance Registry consulted? Not Applicable   Norval Gable, MD 04/10/18 [phone]

## 2018-04-10 NOTE — Discharge Instructions (Addendum)
Flonase nasal spray °

## 2018-09-09 ENCOUNTER — Ambulatory Visit
Admission: EM | Admit: 2018-09-09 | Discharge: 2018-09-09 | Disposition: A | Discharge status: Home / Self Care | Payer: Medicaid Other | Attending: Emergency Medicine | Admitting: Emergency Medicine

## 2018-09-09 ENCOUNTER — Other Ambulatory Visit: Payer: Self-pay

## 2018-09-09 ENCOUNTER — Encounter: Payer: Self-pay | Admitting: Emergency Medicine

## 2018-09-09 DIAGNOSIS — H9201 Otalgia, right ear: Secondary | ICD-10-CM | POA: Diagnosis not present

## 2018-09-09 DIAGNOSIS — J329 Chronic sinusitis, unspecified: Secondary | ICD-10-CM

## 2018-09-09 MED ORDER — METHYLPREDNISOLONE 4 MG PO TBPK: ORAL_TABLET | ORAL | 0 refills | Status: DC

## 2018-09-09 MED ORDER — FLUTICASONE PROPIONATE 50 MCG/ACT NA SUSP: 2.0000 | Freq: Every day | NASAL | 2 refills | Status: DC

## 2018-09-09 MED ORDER — CEFDINIR 300 MG PO CAPS: 300.0000 mg | ORAL_CAPSULE | Freq: Two times a day (BID) | ORAL | 0 refills | Status: AC

## 2018-09-09 NOTE — ED Triage Notes (Signed)
Patient c/o right ear pain that started this morning. Denies any other symptoms.

## 2018-09-09 NOTE — Discharge Instructions (Signed)
Use over the counter ibuprofen (800mg ) with food for the rest of the day (07/08) until able to start steroid on (07/09).

## 2018-09-09 NOTE — ED Provider Notes (Signed)
[facility]    CSN: [id number] Arrival date & time: 09/09/18  1259      History   Chief Complaint Chief Complaint  Patient presents with  . Otalgia    HPI Latasha Singleton is a 38 y.o. female presenting with unilateral ear pain. Pain started suddenly this AM. Pt reports mild to moderate relief with OTC ibuprofen and ear drops. Pt denies any recent swimming, fevers or cough. Pt stated pain radiate to jaw and sinuses as well. Pt has a hx if sinusitis and ear pain and previously received eustachian tubes. No tubes present.   Past Medical History:  Diagnosis Date  . Anxiety   . Grave's disease   . Hypothyroidism   . Kidney stones   . Nephrolithiasis   . Thyroid cancer [facility])     Patient Active Problem List   Diagnosis Date Noted  . Genetic screening 01/14/2016  . Anxiety 12/04/2010  . Grave's disease 12/04/2010  . Hypothyroid 12/04/2010  . Nephrolithiasis     History reviewed. No pertinent surgical history.  OB History    Gravida  2   Para  2   Term      Preterm      AB      Living        SAB      TAB      Ectopic      Multiple      Live Births               Home Medications    Prior to Admission medications   Medication Sig Start Date End Date Taking? Authorizing Provider  clonazePAM (KLONOPIN) 0.5 MG tablet Take by mouth. 03/20/18  Yes [provider]  levonorgestrel (MIRENA) 20 MCG/24HR IUD by Intrauterine route.   Yes [provider]  levothyroxine (SYNTHROID, LEVOTHROID) 112 MCG tablet Take 112 mcg daily by mouth. 12/30/16  Yes [provider]  amoxicillin (AMOXIL) 875 MG tablet Take 1 tablet (875 mg total) by mouth 2 (two) times daily. 04/10/18   Norval Gable, MD  buPROPion (WELLBUTRIN XL) 150 MG 24 hr tablet Take 150 mg 2 (two) times daily by mouth. 12/14/16   [provider]  buPROPion (WELLBUTRIN XL) 300 MG 24 hr tablet Take 1 tablet (300 mg total) by mouth daily. Patient not taking: Reported  on 01/20/2017 07/04/16   Elvin So, MD  cefdinir (OMNICEF) 300 MG capsule Take 1 capsule (300 mg total) by mouth 2 (two) times daily for 10 days. Only start if no change with oral and nasal steroid. 09/12/18 09/22/18  Gertie Baron, NP  diazepam (VALIUM) 5 MG tablet Take 1 tablet (5 mg total) every 8 (eight) hours as needed by mouth (take with meclizine as needed for vertigo or for anxiety). 01/20/17   Earleen Newport, MD  fluticasone (FLONASE) 50 MCG/ACT nasal spray Place 2 sprays into both nostrils daily. 09/09/18   Gertie Baron, NP  meclizine (ANTIVERT) 25 MG tablet Take 1 tablet (25 mg total) 3 (three) times daily as needed by mouth for dizziness or nausea. 01/20/17   Earleen Newport, MD  methylPREDNISolone (MEDROL DOSEPAK) 4 MG TBPK tablet Take as instructed on package. 09/09/18   Gertie Baron, NP    Family History Family History  Problem Relation Age of Onset  . Diabetes Father        TYPE 2  . Hepatitis Father   . Liver disease Father   . Hyperlipidemia Mother   .  Anxiety disorder Mother   . Melanoma Mother 33  . Stroke Paternal Grandmother   . Heart disease Paternal Grandmother        heart attack.  Marland Kitchen Heart disease Paternal Grandfather        heart attack  . Breast cancer Maternal Aunt 67  . Breast cancer Maternal Grandmother 60  . Colon cancer Maternal Grandmother 33  . Anxiety disorder Sister   . Anxiety disorder Sister     Social History Social History   Tobacco Use  . Smoking status: Current Every Day Smoker    Packs/day: 1.00    Types: Cigarettes  . Smokeless tobacco: Never Used  Substance Use Topics  . Alcohol use: No    Comment: occassionally  . Drug use: No     Allergies   Patient has no known allergies.   Review of Systems Review of Systems  Constitutional: Negative for chills, diaphoresis, fatigue and fever.  HENT: Positive for ear pain and sinus pain. Negative for congestion, dental problem, ear discharge, facial swelling,  hearing loss, sinus pressure, sneezing, sore throat and tinnitus.   Eyes: Negative for pain.  Respiratory: Negative for cough.      Physical Exam Triage Vital Signs ED Triage Vitals  Enc Vitals Group     BP 09/09/18 1310 118/84     Pulse Rate 09/09/18 1310 84     Resp 09/09/18 1310 18     Temp 09/09/18 1310 98.4 F (36.9 C)     Temp Source 09/09/18 1310 Oral     SpO2 09/09/18 1310 100 %     Weight 09/09/18 1311 152 lb (68.9 kg)     Height 09/09/18 1311 5\' 4"  (1.626 m)     Head Circumference --      Peak Flow --      Pain Score 09/09/18 1311 8     Pain Loc --      Pain Edu? --      Excl. in [place]? --    No data found.  Updated Vital Signs BP 118/84 (BP Location: Right Arm)   Pulse 84   Temp 98.4 F (36.9 C) (Oral)   Resp 18   Ht 5\' 4"  (1.626 m)   Wt 152 lb (68.9 kg)   SpO2 100%   BMI 26.09 kg/m    Physical Exam Vitals signs and nursing note reviewed.  Constitutional:      Appearance: Normal appearance.  HENT:     Head:     Jaw: No tenderness or swelling.     Right Ear: Hearing normal. No middle ear effusion. There is mastoid tenderness. Tympanic membrane is scarred and retracted. Tympanic membrane is not injected, perforated or bulging.     Left Ear: Tympanic membrane normal.     Nose:     Right Sinus: Maxillary sinus tenderness and frontal sinus tenderness present.  Neurological:     Mental Status: She is alert and oriented to person, place, and time.      UC Treatments / Results  Labs (all labs ordered are listed, but only abnormal results are displayed) Labs Reviewed - No data to display  EKG   Radiology No results found.  Procedures Procedures (including critical care time)  Medications Ordered in UC Medications - No data to display  Initial Impression / Assessment and Plan / UC Course  I have reviewed the triage vital signs and the nursing notes.  Pertinent labs & imaging results that were available during my care of  the patient were  reviewed by me and considered in my medical decision making (see chart for details).     Pt presenting with unilateral otalgia and sinus pressure, more than lieskeela related with eustachian tube dysfunction. Pt treated with a medrol dose pack and felons. Pt instructed to fill cefdinir only if pain in unchanging or worse with the formation POC. Pt and NP had long discussion regarding over use of abx and pt agreed to take steroids first. All questions answered and all concerns addressed.   Final Clinical Impressions(s) / UC Diagnoses   Final diagnoses:  Right ear pain  Chronic sinusitis, unspecified location     Discharge Instructions     Use over the counter ibuprofen (800mg ) with food for the rest of the day (07/08) until able to start steroid on (07/09).    ED Prescriptions    Medication Sig Dispense Auth. Provider   cefdinir (OMNICEF) 300 MG capsule Take 1 capsule (300 mg total) by mouth 2 (two) times daily for 10 days. Only start if no change with oral and nasal steroid. 20 capsule Gertie Baron, NP   fluticasone (FLONASE) 50 MCG/ACT nasal spray Place 2 sprays into both nostrils daily. 18.2 mL Gertie Baron, NP   methylPREDNISolone (MEDROL DOSEPAK) 4 MG TBPK tablet Take as instructed on package. 1 each Gertie Baron, NP        Gertie Baron, NP 09/09/18 1416

## 2019-03-11 ENCOUNTER — Telehealth: Payer: Self-pay | Admitting: Obstetrics and Gynecology

## 2019-03-11 ENCOUNTER — Telehealth: Payer: Self-pay | Admitting: Obstetrics & Gynecology

## 2019-03-11 NOTE — Telephone Encounter (Signed)
[facility] primary Care referring for IUD contraception. Called and left voicemail for patient to call back to be schedule

## 2019-03-11 NOTE — Telephone Encounter (Signed)
1/20 at 930 for mirena replacement with ABC

## 2019-03-18 NOTE — Telephone Encounter (Signed)
Noted. Mirena reserved

## 2019-03-23 NOTE — Progress Notes (Signed)
   Chief Complaint  Patient presents with  . Contraception    Mirena replacement     History of Present Illness:  Latasha Singleton is a 39 y.o. that had a Mirena IUD placed approximately 5 1/2 years ago (09/2013). She had been amenorrheic except occas light BTB. She has been having periods the past 2 months, lasting 3-4 days, mod flow and with dysmen, improved with ibup. Would like IUD replaced.  Last pap 11/17 with PCP  BP 110/70   Ht 5\' 6"  (1.676 m)   Wt 165 lb (74.8 kg)   LMP 03/09/2019 (Approximate)   BMI 26.63 kg/m   Pelvic exam:  Two IUD strings present seen coming from the cervical os. EGBUS, vaginal vault and cervix: within normal limits  IUD Removal Strings of IUD identified and grasped.  IUD removed without problem with ring forceps.  Pt tolerated this well.  IUD noted to be intact.  IUD Insertion Procedure Note Patient identified, informed consent performed, consent signed.   Discussed risks of irregular bleeding, cramping, infection, malpositioning or misplacement of the IUD outside the uterus which may require further procedure such as laparoscopy, risk of failure <1%. Time out was performed.    Speculum placed in the vagina.  Cervix visualized.  Cleaned with Betadine x 2.  Grasped anteriorly with a single tooth tenaculum.  Uterus sounded to 6.0 cm.   IUD placed per manufacturer's recommendations.  Strings trimmed to 3 cm. Tenaculum was removed, good hemostasis noted.  Patient tolerated procedure well.   ASSESSMENT:  Encounter for removal and reinsertion of intrauterine contraceptive device (IUD) - Plan: levonorgestrel (MIRENA) 20 MCG/24HR IUD   Meds ordered this encounter  Medications  . levonorgestrel (MIRENA) 20 MCG/24HR IUD    Sig: by Intrauterine route once for 1 dose.    Dispense:  1 each    Refill:  0    Order Specific Question:   Supervising Provider    Answer:   Latasha Singleton [id number]     Plan:  Patient was given post-procedure instructions.  She  was advised to have backup contraception for one week.   Call if you are having increasing pain, cramps or bleeding or if you have a fever greater than 100.4 degrees F., shaking chills, nausea or vomiting. Patient was also asked to check IUD strings periodically and follow up in 4 weeks for IUD check.  Return in about 4 weeks (around 04/21/2019) for IUD f/u.  Latasha Singleton B. Latasha Caissie, PA-C 03/24/2019 10:07 AM

## 2019-03-24 ENCOUNTER — Encounter: Payer: Self-pay | Admitting: Obstetrics and Gynecology

## 2019-03-24 ENCOUNTER — Ambulatory Visit (INDEPENDENT_AMBULATORY_CARE_PROVIDER_SITE_OTHER): Payer: Medicaid Other | Admitting: Obstetrics and Gynecology

## 2019-03-24 ENCOUNTER — Other Ambulatory Visit: Payer: Self-pay

## 2019-03-24 VITALS — BP 110/70 | Ht 66.0 in | Wt 165.0 lb

## 2019-03-24 DIAGNOSIS — Z30433 Encounter for removal and reinsertion of intrauterine contraceptive device: Secondary | ICD-10-CM

## 2019-03-24 MED ORDER — LEVONORGESTREL 20 MCG/24HR IU IUD: INTRAUTERINE_SYSTEM | Freq: Once | INTRAUTERINE | 0 refills | Status: AC

## 2019-03-24 NOTE — Patient Instructions (Signed)
I value your feedback and entrusting us with your care. If you get a [facility] patient survey, I would appreciate you taking the time to let us know about your experience today. Thank you!  As of February 11, 2019, your lab results will be released to your MyChart immediately, before I even have a chance to see them. Please give me time to review them and contact you if there are any abnormalities. Thank you for your patience.   [facility] [phone]  Instructions after IUD insertion  Most women experience no significant problems after insertion of an IUD, however minor cramping and spotting for a few days is common. Cramps may be treated with ibuprofen 800mg every 8 hours or Tylenol 650 mg every 4 hours. Contact [facility] immediately if you experience any of the following symptoms during the next week: temperature >99.6 degrees, worsening pelvic pain, abdominal pain, fainting, unusually heavy vaginal bleeding, foul vaginal discharge, or if you think you have expelled the IUD.  Nothing inserted in the vagina for 48 hours. You will be scheduled for a follow up visit in approximately four weeks.  You should check monthly to be sure you can feel the IUD strings in the upper vagina. If you are having a monthly period, try to check after each period. If you cannot feel the IUD strings,  contact [facility] immediately so we can do an exam to determine if the IUD has been expelled.   Please use backup protection until we can confirm the IUD is in place.  Call [facility] if you are exposed to or diagnosed with a sexually transmitted infection, as we will need to discuss whether it is safe for you to continue using an IUD.   

## 2019-04-25 NOTE — Progress Notes (Deleted)
   No chief complaint on file.    History of Present Illness:  Latasha Singleton is a 39 y.o. that had a Mirena IUD placed approximately 1 month ago. Since that time, she denies dyspareunia, pelvic pain, non-menstrual bleeding, vaginal d/c, heavy bleeding.   Review of Systems  Physical Exam:  There were no vitals taken for this visit. There is no height or weight on file to calculate BMI.  Pelvic exam:  Two IUD strings {DESC; PRESENT/ABSENT:17923::"present"} seen coming from the cervical os. EGBUS, vaginal vault and cervix: within normal limits   Assessment:   No diagnosis found.  IUD strings present in proper location; pt doing well  Plan: F/u if any signs of infection or can no longer feel the strings.   Felma Pfefferle B. Chyna Kneece, PA-C 04/25/2019 6:17 PM

## 2019-04-26 ENCOUNTER — Ambulatory Visit: Payer: Medicaid Other | Admitting: Obstetrics and Gynecology

## 2019-06-30 ENCOUNTER — Encounter: Payer: Self-pay | Admitting: Emergency Medicine

## 2019-06-30 ENCOUNTER — Other Ambulatory Visit: Payer: Self-pay

## 2019-06-30 ENCOUNTER — Ambulatory Visit
Admission: EM | Admit: 2019-06-30 | Discharge: 2019-06-30 | Disposition: A | Discharge status: Home / Self Care | Payer: Medicaid Other | Attending: Family Medicine | Admitting: Family Medicine

## 2019-06-30 DIAGNOSIS — H6501 Acute serous otitis media, right ear: Secondary | ICD-10-CM

## 2019-06-30 MED ORDER — AMOXICILLIN-POT CLAVULANATE 875-125 MG PO TABS: 1.0000 | ORAL_TABLET | Freq: Two times a day (BID) | ORAL | 0 refills | Status: DC

## 2019-06-30 NOTE — ED Provider Notes (Signed)
[facility]    CSN: [id number] Arrival date & time: 06/30/19  1635      History   Chief Complaint Chief Complaint  Patient presents with  . Ear Fullness   HPI  39 year old female presents with the above complaint.  Patient reports a 2-day history of pain and fullness of the right ear.  She states that she has had ongoing sinus issues.  She states that she was at [facility] pediatrics with her daughter and the provider there examined her ear and told her to come down here for evaluation.  No fever.  No other reported symptoms.  No other complaints or concerns at this time.  Past Medical History:  Diagnosis Date  . Anxiety   . Grave's disease   . Hypothyroidism   . Kidney stones   . Nephrolithiasis   . Thyroid cancer [facility])    Patient Active Problem List   Diagnosis Date Noted  . Genetic screening 01/14/2016  . Anxiety 12/04/2010  . Grave's disease 12/04/2010  . Hypothyroid 12/04/2010  . Nephrolithiasis    OB History    Gravida  2   Para  2   Term  2   Preterm      AB      Living  2     SAB      TAB      Ectopic      Multiple      Live Births  2           Home Medications    Prior to Admission medications   Medication Sig Start Date End Date Taking? Authorizing Provider  clonazePAM (KLONOPIN) 0.5 MG tablet Take by mouth. 03/20/18  Yes [provider]  fluticasone (FLONASE) 50 MCG/ACT nasal spray Place into both nostrils daily.   Yes [provider]  levothyroxine (SYNTHROID, LEVOTHROID) 112 MCG tablet Take 112 mcg daily by mouth. 12/30/16  Yes [provider]  amoxicillin-clavulanate (AUGMENTIN) 875-125 MG tablet Take 1 tablet by mouth 2 (two) times daily. 06/30/19   Coral Spikes, DO  levonorgestrel (MIRENA) 20 MCG/24HR IUD by Intrauterine route once for 1 dose. 03/24/19 [id number]  Copland, Deirdre Evener, PA-C    Family History Family History  Problem Relation Age of Onset  . Diabetes Father        TYPE 2  .  Hepatitis Father   . Liver disease Father   . Hyperlipidemia Mother   . Anxiety disorder Mother   . Melanoma Mother 62  . Stroke Paternal Grandmother   . Heart disease Paternal Grandmother        heart attack.  Marland Kitchen Heart disease Paternal Grandfather        heart attack  . Breast cancer Maternal Aunt 67  . Breast cancer Maternal Grandmother 60  . Colon cancer Maternal Grandmother 77  . Anxiety disorder Sister   . Anxiety disorder Sister   . Bone cancer Maternal Aunt     Social History Social History   Tobacco Use  . Smoking status: Current Every Day Smoker    Packs/day: 1.00    Types: Cigarettes  . Smokeless tobacco: Never Used  Substance Use Topics  . Alcohol use: No    Comment: occassionally  . Drug use: No     Allergies   Patient has no known allergies.   Review of Systems Review of Systems  Constitutional: Negative.   HENT: Positive for ear pain.    Physical Exam Triage Vital Signs  ED Triage Vitals  Enc Vitals Group     BP 06/30/19 1646 111/74     Pulse Rate 06/30/19 1646 79     Resp 06/30/19 1646 18     Temp 06/30/19 1646 98.4 F (36.9 C)     Temp Source 06/30/19 1646 Oral     SpO2 06/30/19 1646 100 %     Weight 06/30/19 1644 156 lb (70.8 kg)     Height 06/30/19 1644 5\' 6"  (1.676 m)     Head Circumference --      Peak Flow --      Pain Score 06/30/19 1644 0     Pain Loc --      Pain Edu? --      Excl. in [place]? --    Updated Vital Signs BP 111/74 (BP Location: Right Arm)   Pulse 79   Temp 98.4 F (36.9 C) (Oral)   Resp 18   Ht 5\' 6"  (1.676 m)   Wt 70.8 kg   SpO2 100%   BMI 25.18 kg/m   Visual Acuity Right Eye Distance:   Left Eye Distance:   Bilateral Distance:    Right Eye Near:   Left Eye Near:    Bilateral Near:     Physical Exam Vitals and nursing note reviewed.  Constitutional:      General: She is not in acute distress.    Appearance: Normal appearance. She is not ill-appearing.  HENT:     Head: Normocephalic.     Ears:      Comments: Right TM with effusion. Eyes:     General:        Right eye: No discharge.        Left eye: No discharge.     Conjunctiva/sclera: Conjunctivae normal.  Pulmonary:     Effort: Pulmonary effort is normal. No respiratory distress.  Neurological:     Mental Status: She is alert.  Psychiatric:        Mood and Affect: Mood normal.        Behavior: Behavior normal.    UC Treatments / Results  Labs (all labs ordered are listed, but only abnormal results are displayed) Labs Reviewed - No data to display  EKG   Radiology No results found.  Procedures Procedures (including critical care time)  Medications Ordered in UC Medications - No data to display  Initial Impression / Assessment and Plan / UC Course  I have reviewed the triage vital signs and the nursing notes.  Pertinent labs & imaging results that were available during my care of the patient were reviewed by me and considered in my medical decision making (see chart for details).    39 year old female presents with right serous otitis media. Treating with Augmentin.  Final Clinical Impressions(s) / UC Diagnoses   Final diagnoses:  Right acute serous otitis media, recurrence not specified   Discharge Instructions   None    ED Prescriptions    Medication Sig Dispense Auth. Provider   amoxicillin-clavulanate (AUGMENTIN) 875-125 MG tablet Take 1 tablet by mouth 2 (two) times daily. 20 tablet Coral Spikes, DO     PDMP not reviewed this encounter.   Coral Spikes, [place] 06/30/19 1753

## 2019-06-30 NOTE — ED Triage Notes (Signed)
Patient c/o right ear fullness that started 2 days ago. She states she was upstairs at [employer] with her daughter and they looked in her ear and told her she needed to be seen here.

## 2020-03-21 ENCOUNTER — Other Ambulatory Visit: Payer: Self-pay

## 2020-03-21 ENCOUNTER — Ambulatory Visit
Admission: EM | Admit: 2020-03-21 | Discharge: 2020-03-21 | Disposition: A | Discharge status: Home / Self Care | Payer: Medicaid Other | Attending: Family Medicine | Admitting: Family Medicine

## 2020-03-21 DIAGNOSIS — J0110 Acute frontal sinusitis, unspecified: Secondary | ICD-10-CM | POA: Diagnosis not present

## 2020-03-21 MED ORDER — BENZONATATE 100 MG PO CAPS: 100.0000 mg | ORAL_CAPSULE | Freq: Three times a day (TID) | ORAL | 0 refills | Status: AC

## 2020-03-21 MED ORDER — AMOXICILLIN-POT CLAVULANATE 875-125 MG PO TABS: 1.0000 | ORAL_TABLET | Freq: Two times a day (BID) | ORAL | 0 refills | Status: DC

## 2020-03-21 NOTE — ED Triage Notes (Signed)
Pt has sinus issues for two weeks at end of December which resolved with OTC meds.  S/s returned 4 days ago.  Mucinex not helping.

## 2020-03-21 NOTE — Discharge Instructions (Addendum)
Treating you for a sinus infection Medicines as prescribed OTC medicines for symptoms as needed  Follow up as needed for continued or worsening symptoms

## 2020-03-21 NOTE — ED Triage Notes (Signed)
Does not want COVID test.

## 2020-03-21 NOTE — ED Provider Notes (Signed)
[facility]    CSN: [id number] Arrival date & time: 03/21/20  1236      History   Chief Complaint Chief Complaint  Patient presents with  . Facial Pain    Sinus     HPI Latasha Singleton is a 39 y.o. female.   Patient is a 40 year old female who presents today with severe nasal congestion, sinus headache, postnasal drip.  Started approximate 2 weeks ago felt like she was getting better then worsened over the past 4 days.  History of sinus infections.  No fever, chills.  Bilateral ear pressure.  Taking Mucinex and using Flonase without any relief     Past Medical History:  Diagnosis Date  . Anxiety   . Grave's disease   . Hypothyroidism   . Kidney stones   . Nephrolithiasis   . Thyroid cancer [facility])     Patient Active Problem List   Diagnosis Date Noted  . Genetic screening 01/14/2016  . Anxiety 12/04/2010  . Grave's disease 12/04/2010  . Hypothyroid 12/04/2010  . Nephrolithiasis     History reviewed. No pertinent surgical history.  OB History    Gravida  2   Para  2   Term  2   Preterm      AB      Living  2     SAB      IAB      Ectopic      Multiple      Live Births  2            Home Medications    Prior to Admission medications   Medication Sig Start Date End Date Taking? Authorizing Provider  amoxicillin-clavulanate (AUGMENTIN) 875-125 MG tablet Take 1 tablet by mouth every 12 (twelve) hours. 03/21/20  Yes Sayra Frisby A, NP  clonazePAM (KLONOPIN) 0.5 MG tablet Take by mouth. 03/20/18  Yes [provider]  fluticasone (FLONASE) 50 MCG/ACT nasal spray Place into both nostrils daily.   Yes [provider]  levonorgestrel (MIRENA) 20 MCG/24HR IUD by Intrauterine route once for 1 dose. 03/24/19 2/99/37 Yes Copland, Deirdre Evener, PA-C  levothyroxine (SYNTHROID, LEVOTHROID) 112 MCG tablet Take 112 mcg daily by mouth. 12/30/16  Yes [provider]    Family History Family History  Problem Relation Age of  Onset  . Diabetes Father        TYPE 2  . Hepatitis Father   . Liver disease Father   . Hyperlipidemia Mother   . Anxiety disorder Mother   . Melanoma Mother 25  . Cancer Mother   . Stroke Paternal Grandmother   . Heart disease Paternal Grandmother        heart attack.  Marland Kitchen Heart disease Paternal Grandfather        heart attack  . Breast cancer Maternal Aunt 67  . Breast cancer Maternal Grandmother 60  . Colon cancer Maternal Grandmother 14  . Anxiety disorder Sister   . Anxiety disorder Sister   . Bone cancer Maternal Aunt     Social History Social History   Tobacco Use  . Smoking status: Current Every Day Smoker    Packs/day: 1.00    Types: Cigarettes  . Smokeless tobacco: Never Used  Vaping Use  . Vaping Use: Never used  Substance Use Topics  . Alcohol use: No    Comment: occassionally  . Drug use: No     Allergies   Patient has no known allergies.   Review of Systems  Review of Systems   Physical Exam Triage Vital Signs ED Triage Vitals  Enc Vitals Group     BP 03/21/20 1303 120/78     Pulse Rate 03/21/20 1303 63     Resp 03/21/20 1303 18     Temp 03/21/20 1303 98 F (36.7 C)     Temp Source 03/21/20 1303 Oral     SpO2 03/21/20 1303 98 %     Weight --      Height --      Head Circumference --      Peak Flow --      Pain Score 03/21/20 1318 0     Pain Loc --      Pain Edu? --      Excl. in [place]? --    No data found.  Updated Vital Signs BP 120/78 (BP Location: Left Arm)   Pulse 63   Temp 98 F (36.7 C) (Oral)   Resp 18   SpO2 98%   Visual Acuity Right Eye Distance:   Left Eye Distance:   Bilateral Distance:    Right Eye Near:   Left Eye Near:    Bilateral Near:     Physical Exam Vitals and nursing note reviewed.  Constitutional:      General: She is not in acute distress.    Appearance: Normal appearance. She is not ill-appearing, toxic-appearing or diaphoretic.  HENT:     Head: Normocephalic.     Right Ear: Tympanic membrane  and ear canal normal.     Left Ear: Tympanic membrane and ear canal normal.     Nose: Congestion present.     Mouth/Throat:     Pharynx: Oropharynx is clear.  Eyes:     Conjunctiva/sclera: Conjunctivae normal.  Cardiovascular:     Rate and Rhythm: Normal rate and regular rhythm.  Pulmonary:     Effort: Pulmonary effort is normal.     Breath sounds: Normal breath sounds.  Musculoskeletal:        General: Normal range of motion.     Cervical back: Normal range of motion.  Skin:    General: Skin is warm and dry.     Findings: No rash.  Neurological:     Mental Status: She is alert.  Psychiatric:        Mood and Affect: Mood normal.      UC Treatments / Results  Labs (all labs ordered are listed, but only abnormal results are displayed) Labs Reviewed - No data to display  EKG   Radiology No results found.  Procedures Procedures (including critical care time)  Medications Ordered in UC Medications - No data to display  Initial Impression / Assessment and Plan / UC Course  I have reviewed the triage vital signs and the nursing notes.  Pertinent labs & imaging results that were available during my care of the patient were reviewed by me and considered in my medical decision making (see chart for details).     Sinusitis 2 weeks or more of symptoms.  Treating for bacterial sinus infection.  Antibiotics as prescribed. Over-the-counter medicines as needed. Tessalon pearls for cough.  Follow up as needed for continued or worsening symptoms  Final Clinical Impressions(s) / UC Diagnoses   Final diagnoses:  Acute non-recurrent frontal sinusitis     Discharge Instructions     Treating you for a sinus infection Medicines as prescribed OTC medicines for symptoms as needed  Follow up as needed for continued or worsening symptoms  ED Prescriptions    Medication Sig Dispense Auth. Provider   amoxicillin-clavulanate (AUGMENTIN) 875-125 MG tablet Take 1 tablet by  mouth every 12 (twelve) hours. 14 tablet Lauren Modisette A, NP     PDMP not reviewed this encounter.   Loura Halt A, NP 03/21/20 1400

## 2021-02-02 NOTE — Telephone Encounter (Signed)
Mirena rcvd/charged 03/24/2019

## 2021-07-06 ENCOUNTER — Ambulatory Visit: Payer: Medicaid Other | Admitting: Family Medicine

## 2021-07-24 ENCOUNTER — Other Ambulatory Visit (HOSPITAL_COMMUNITY)
Admission: RE | Admit: 2021-07-24 | Discharge: 2021-07-24 | Disposition: A | Discharge status: Home / Self Care | Payer: Medicaid Other | Source: Ambulatory Visit | Attending: Family Medicine | Admitting: Family Medicine

## 2021-07-24 ENCOUNTER — Ambulatory Visit (INDEPENDENT_AMBULATORY_CARE_PROVIDER_SITE_OTHER): Payer: Medicaid Other | Admitting: Family Medicine

## 2021-07-24 VITALS — BP 122/70 | Ht 66.0 in | Wt 165.0 lb

## 2021-07-24 DIAGNOSIS — Z01419 Encounter for gynecological examination (general) (routine) without abnormal findings: Secondary | ICD-10-CM

## 2021-07-24 DIAGNOSIS — F4321 Adjustment disorder with depressed mood: Secondary | ICD-10-CM

## 2021-07-24 DIAGNOSIS — N6321 Unspecified lump in the left breast, upper outer quadrant: Secondary | ICD-10-CM | POA: Diagnosis not present

## 2021-07-24 DIAGNOSIS — Z01411 Encounter for gynecological examination (general) (routine) with abnormal findings: Secondary | ICD-10-CM

## 2021-07-24 DIAGNOSIS — F419 Anxiety disorder, unspecified: Secondary | ICD-10-CM

## 2021-07-24 NOTE — Progress Notes (Signed)
GYNECOLOGY ANNUAL PREVENTATIVE CARE ENCOUNTER NOTE  Subjective:   Latasha Singleton is a 41 y.o. [id number] female here for a routine annual gynecologic exam.  Current complaints: Patient is going through a hard time, mother has stage 4 lung cancer and patient is supporting her and also teenage children..     Menses are irregular-- had a cycle last month.   Denies abnormal vaginal bleeding, discharge, pelvic pain, problems with intercourse or other gynecologic concerns.    Gynecologic History No LMP recorded. (Menstrual status: IUD). Contraception: IUD Last Pap: 2018- WNL Last mammogram: Needs.   Health Maintenance Due  Topic Date Due   COVID-19 Vaccine (1) Never done   HIV Screening  Never done   PAP SMEAR-Modifier  04/28/2016    The following portions of the patient's history were reviewed and updated as appropriate: allergies, current medications, past family history, past medical history, past social history, past surgical history and problem list.  Review of Systems Pertinent items are noted in HPI.   Objective:  BP 122/70   Ht '5\' 6"'$  (1.676 m)   Wt 165 lb (74.8 kg)   BMI 26.63 kg/m  CONSTITUTIONAL: Well-developed, well-nourished female in no acute distress.  HENT:  Normocephalic, atraumatic, External right and left ear normal. Oropharynx is clear and moist EYES:  No scleral icterus.  NECK: Normal range of motion, supple, no masses.  Normal thyroid.  SKIN: Skin is warm and dry. No rash noted. Not diaphoretic. No erythema. No pallor. NEUROLOGIC: Alert and oriented to person, place, and time. Normal reflexes, muscle tone coordination. No cranial nerve deficit noted. PSYCHIATRIC: Normal mood and affect. Normal behavior. Normal judgment and thought content. CARDIOVASCULAR: Normal heart rate noted, regular rhythm. 2+ distal pulses. RESPIRATORY: Effort and breath sounds normal, no problems with respiration noted.  BREASTS: Symmetric in size.  Left: 2-3cm mass in the upper outer  quadrant of left breast, 1 o'clock,  This area is TTP. No skin changes, nipple drainage, or lymphadenopathy. Right- < 1 cm mass on the right, in line with nipple lateral (9 o'clock). Area is TTP. No skin change. No nipple drainage.  ABDOMEN: Soft,  no distention noted.  No tenderness, rebound or guarding.  PELVIC: Normal appearing external genitalia; normal appearing vaginal mucosa and anterior cervix.  No abnormal discharge noted.  Pap smear obtained.  IUD strings seen. Normal uterine size, no other palpable masses, no uterine or adnexal tenderness. MUSCULOSKELETAL: Normal range of motion.      Assessment and Plan:  1) Annual gynecologic examination with pap smear:   Routine preventative health maintenance measures emphasized. Reviewed perimenopausal symptoms and management.   2) Contraception counseling: Likes IUD, will keep  1. Mass of upper outer quadrant of left breast - Discussed findings on exam with client, exam favors benign but needs further investigation - [place]; Future - MM DIAG BREAST TOMO BILATERAL; Future  2. Well woman exam with routine gynecological exam - Cytology - PAP  3. Grief reaction Recommended counseling with hospice vs finding therapist on [email or web address] Discussed sharing her story with teen children as well  Patient was prescribed Lexapro by another MD and I encouraged her to take this medication. She used klonipin prior to visit today.   4. Anxiety  5. Smoking-- encouraged continued reduction and eventual cessation   Please refer to After Visit Summary for other counseling recommendations.   No follow-ups on file.  Caren Macadam, MD, MPH, ABFM Attending Physician Center for [facility]  Care

## 2021-07-27 LAB — CYTOLOGY - PAP
Comment: NEGATIVE
Diagnosis: UNDETERMINED — AB
High risk HPV: NEGATIVE

## 2021-07-31 NOTE — Progress Notes (Signed)
Pt has seen MyChart msg.

## 2021-08-10 ENCOUNTER — Ambulatory Visit
Admission: RE | Admit: 2021-08-10 | Discharge: 2021-08-10 | Disposition: A | Discharge status: Home / Self Care | Payer: Medicaid Other | Source: Ambulatory Visit | Attending: Family Medicine | Admitting: Family Medicine

## 2021-08-10 ENCOUNTER — Other Ambulatory Visit: Payer: Self-pay | Admitting: Family Medicine

## 2021-08-10 DIAGNOSIS — N6321 Unspecified lump in the left breast, upper outer quadrant: Secondary | ICD-10-CM | POA: Insufficient documentation

## 2021-08-10 DIAGNOSIS — N6310 Unspecified lump in the right breast, unspecified quadrant: Secondary | ICD-10-CM | POA: Diagnosis present

## 2022-01-18 ENCOUNTER — Encounter: Payer: Self-pay | Admitting: Emergency Medicine

## 2022-01-18 ENCOUNTER — Ambulatory Visit
Admission: EM | Admit: 2022-01-18 | Discharge: 2022-01-18 | Disposition: A | Discharge status: Home / Self Care | Payer: Medicaid Other | Attending: Family Medicine | Admitting: Family Medicine

## 2022-01-18 DIAGNOSIS — R300 Dysuria: Secondary | ICD-10-CM | POA: Insufficient documentation

## 2022-01-18 DIAGNOSIS — B9689 Other specified bacterial agents as the cause of diseases classified elsewhere: Secondary | ICD-10-CM | POA: Insufficient documentation

## 2022-01-18 DIAGNOSIS — N760 Acute vaginitis: Secondary | ICD-10-CM | POA: Diagnosis present

## 2022-01-18 LAB — WET PREP, GENITAL
Sperm: NONE SEEN
Trich, Wet Prep: NONE SEEN
WBC, Wet Prep HPF POC: <10 — AB (ref <10)
Yeast Wet Prep HPF POC: NONE SEEN

## 2022-01-18 LAB — URINALYSIS, ROUTINE W REFLEX MICROSCOPIC
Bilirubin Urine: NEGATIVE
Glucose, UA: NEGATIVE mg/dL
Hgb urine dipstick: NEGATIVE
Ketones, ur: NEGATIVE mg/dL
Leukocytes,Ua: NEGATIVE
Nitrite: NEGATIVE
Protein, ur: NEGATIVE mg/dL
Specific Gravity, Urine: <1.005 — ABNORMAL LOW (ref 1.005–1.030)
pH: 6 (ref 5.0–8.0)

## 2022-01-18 MED ORDER — CYCLOBENZAPRINE HCL 10 MG PO TABS: 10.0000 mg | ORAL_TABLET | Freq: Two times a day (BID) | ORAL | 0 refills | Status: AC | PRN

## 2022-01-18 MED ORDER — METRONIDAZOLE 500 MG PO TABS: 500.0000 mg | ORAL_TABLET | Freq: Two times a day (BID) | ORAL | 0 refills | Status: AC

## 2022-01-18 MED ORDER — IBUPROFEN 800 MG PO TABS: 800.0000 mg | ORAL_TABLET | Freq: Three times a day (TID) | ORAL | 0 refills | Status: AC

## 2022-01-18 NOTE — ED Provider Notes (Signed)
[facility]    CSN: [id number] Arrival date & time: 01/18/22  1832      History   Chief Complaint Chief Complaint  Patient presents with   Back Pain   Vaginal Discharge     HPI HPI Chiffon Clarisa Fling is a 41 y.o. female.    Elanor presents for lower back pain and has stabbing lower abdominal pain. It hurts but not burns when she urinates. She has had some vaginal discharge yesterday. She has history of kidney stones. She took AZO this morning.   She had an IUD placed in 2021.  She doesn't have a period.  She has been under a lot more stress.        Dysuria: Pain urinating started *** days ago. Patient reports *** urgency, *** blood in urine and *** frequency. She has tried *** for this. Reports ***antibiotics in the last 30 days.  *** more than 3 UTIs in the last 12 months. Patient reports *** exposure to an STI.   Patient *** pregnant.   Patient reports *** CVA tenderness, ***fevers, ***vaginal discharge, or ***mouth ulcers.   Deajah here for STD screening.  ***Denies known STI exposure.   Reports no ***symptoms in ***herself or in partner. Britta does ***not use condoms regularly.   - Contraception: *** - Abnormal vaginal discharge: *** - Missed period: ***  No LMP recorded. (Menstrual status: IUD). - Fever: *** - Abdominal pain *** - Pelvic pain: *** - Vaginal bleeding: *** - Pain during sex: *** - Rash: *** - Sore throat: no   - Arthralgias: no - Nausea: no - Vomiting: no - Dysuria: no *** - Back Pain: no *** - Headache: no       Past Medical History:  Diagnosis Date   Anxiety    Grave's disease    Hypothyroidism    Kidney stones    Nephrolithiasis    Thyroid cancer [facility])     Patient Active Problem List   Diagnosis Date Noted   Genetic screening 01/14/2016   Anxiety 12/04/2010   Grave's disease 12/04/2010   Hypothyroid 12/04/2010   Nephrolithiasis     History reviewed. No pertinent surgical history.  OB History     Gravida  2    Para  2   Term  2   Preterm      AB      Living  2      SAB      IAB      Ectopic      Multiple      Live Births  2            Home Medications    Prior to Admission medications   Medication Sig Start Date End Date Taking? Authorizing Provider  clonazePAM (KLONOPIN) 0.5 MG tablet Take by mouth. 03/20/18  Yes [provider]  levonorgestrel (MIRENA) 20 MCG/24HR IUD by Intrauterine route once for 1 dose. 03/24/19 88/50/27 Yes Copland, Deirdre Evener, PA-C  levothyroxine (SYNTHROID, LEVOTHROID) 112 MCG tablet Take 112 mcg daily by mouth. 12/30/16  Yes [provider]  amoxicillin-clavulanate (AUGMENTIN) 875-125 MG tablet Take 1 tablet by mouth every 12 (twelve) hours. Patient not taking: Reported on 07/24/2021 03/21/20   Loura Halt A, NP  benzonatate (TESSALON) 100 MG capsule Take 1 capsule (100 mg total) by mouth every 8 (eight) hours. Patient not taking: Reported on 07/24/2021 03/21/20   Loura Halt A, NP  fluticasone (FLONASE) 50 MCG/ACT nasal spray Place into both nostrils daily.  [provider]    Family History Family History  Problem Relation Age of Onset   Diabetes Father        TYPE 2   Hepatitis Father    Liver disease Father    Hyperlipidemia Mother    Anxiety disorder Mother    Melanoma Mother 39   Cancer Mother    Stroke Paternal Grandmother    Heart disease Paternal Grandmother        heart attack.   Heart disease Paternal Grandfather        heart attack   Breast cancer Maternal Aunt 67   Breast cancer Maternal Grandmother 60   Colon cancer Maternal Grandmother 60   Anxiety disorder Sister    Anxiety disorder Sister    Bone cancer Maternal Aunt     Social History Social History   Tobacco Use   Smoking status: Every Day    Packs/day: 1.00    Types: Cigarettes   Smokeless tobacco: Never  Vaping Use   Vaping Use: Never used  Substance Use Topics   Alcohol use: No    Comment: occassionally   Drug use: No      Allergies   Patient has no known allergies.   Review of Systems Review of Systems: :negative unless otherwise stated in HPI.      Physical Exam Triage Vital Signs ED Triage Vitals  Enc Vitals Group     BP 01/18/22 2025 120/76     Pulse Rate 01/18/22 2025 73     Resp 01/18/22 2025 14     Temp 01/18/22 2025 98.1 F (36.7 C)     Temp Source 01/18/22 2025 Oral     SpO2 01/18/22 2025 97 %     Weight 01/18/22 2024 159 lb (72.1 kg)     Height 01/18/22 2024 '5\' 6"'$  (1.676 m)     Head Circumference --      Peak Flow --      Pain Score 01/18/22 2024 9     Pain Loc --      Pain Edu? --      Excl. in [place]? --    No data found.  Updated Vital Signs BP 120/76 (BP Location: Right Arm)   Pulse 73   Temp 98.1 F (36.7 C) (Oral)   Resp 14   Ht '5\' 6"'$  (1.676 m)   Wt 72.1 kg   SpO2 97%   BMI 25.66 kg/m   Visual Acuity Right Eye Distance:   Left Eye Distance:   Bilateral Distance:    Right Eye Near:   Left Eye Near:    Bilateral Near:     Physical Exam GEN: well appearing female in no acute distress  CVS: well perfused  RESP: speaking in full sentences without pause  ABD: soft, non-tender, non-distended, no palpable masses ***  GU: deferred, patient performed self swab  ***Pelvic exam: normal external genitalia, vulva, VAGINA and CERVIX: {details:[id number]::normal appearing cervix without discharge or lesions}, ADNEXA: normal adnexa in size, nontender and no masses, WET MOUNT done - results: {:[id number]}, exam chaperoned by CMA.    UC Treatments / Results  Labs (all labs ordered are listed, but only abnormal results are displayed) Labs Reviewed  WET PREP, GENITAL - Abnormal; Notable for the following components:      Result Value   Clue Cells Wet Prep HPF POC PRESENT (*)    WBC, Wet Prep HPF POC <10 (*)    All other components within normal limits  URINALYSIS,  ROUTINE W REFLEX MICROSCOPIC - Abnormal; Notable for the following components:   Specific Gravity, Urine <1.005  (*)    All other components within normal limits    EKG   Radiology No results found.  Procedures Procedures (including critical care time)  Medications Ordered in UC Medications - No data to display  Initial Impression / Assessment and Plan / UC Course  I have reviewed the triage vital signs and the nursing notes.  Pertinent labs & imaging results that were available during my care of the patient were reviewed by me and considered in my medical decision making (see chart for details).     ***  STI Screening  ***High risk heterosexual behavior GC and chlamydia DNA  probe sent to lab. HIV and RPR ***collected. ***Negative Trichomoniasis on wet prep.  - Treatment: ***500 mg IM CTX, po doxycycline 100 mg BID x7 days - ***Flagyl 500 BID x 7 days and . Advised patient to not drink alcohol while taking Flagyl.  - Abstain from coitus during course of treatment - Advised patient to use barrier protection/condoms.  - F/U if symptoms not improving or getting worse.  - Self care instructions given including avoiding douching. Handout given.  - F/U with PCP as needed.  - Return precautions including abdominal pain, fever, chills, nausea, or vomiting given.    ***Acute cystitis:  Patient is a 41 y.o. female  who presents for ***days of ***dysuria and ***urinary urgency.  Overall patient is well-appearing and afebrile.  Vital signs stable.  UA consistent with ***pyuria.  Hematuria not ***supported on microscopy.  Patient has *** UTI in the past year. Treat with ***Keflex 4 times daily for 5 days.  ***Urine culture obtained.  Follow-up sensitivities and change antibiotics, if needed.   -Follow-up if symptoms not improving or getting worse - ***Handout given.  - Return precautions including abdominal pain, fever, chills, nausea, or vomiting given.   Discussed MDM, treatment plan and plan for follow-up with patient/parent who agrees with plan.     ***BV (bacterial vaginosis)   Confirmed on wet prep. G/C [place] is pending. Symptoms consistent with this.  - Treatment: Flagyl 500 BID x 7 days and abstain from coitus during course of treatment. Advised patient to not drink alcohol while taking this medication.  - F/U if symptoms not improving or getting worse.  - Will f/u on G/C Chlamydia and call in Rx if positive.  - Self care instructions given including avoiding douching. Handout given.  - F/U with PCP as needed.  - Return precautions including abdominal pain, fever, chills, nausea, or vomiting given.     Discussed MDM, treatment plan and plan for follow-up with patient/parent who agrees with plan.      Final Clinical Impressions(s) / UC Diagnoses   Final diagnoses:  None   Discharge Instructions   None    ED Prescriptions   None    PDMP not reviewed this encounter.

## 2022-01-18 NOTE — Discharge Instructions (Addendum)
Stop by the pharmacy to pick up your prescriptions.  Follow up with your primary care provider as needed.  We sent you urine for culture to see if we need to start antibiotics. If you pain does not subside in the next few days or your having increasing pain be sure to be seen at the emergency department for imaging to see if you have a kidney stones.

## 2022-01-18 NOTE — ED Triage Notes (Signed)
Patient reports vaginal pain and discharge, pain when urinating and lower back pain that started yesterday.  Patient not concerned with STDs.

## 2022-01-20 LAB — URINE CULTURE

## 2022-03-25 ENCOUNTER — Other Ambulatory Visit: Payer: Self-pay | Admitting: Family

## 2022-03-25 DIAGNOSIS — N6310 Unspecified lump in the right breast, unspecified quadrant: Secondary | ICD-10-CM

## 2022-04-19 ENCOUNTER — Ambulatory Visit
Admission: RE | Admit: 2022-04-19 | Discharge: 2022-04-19 | Disposition: A | Discharge status: Home / Self Care | Payer: Medicaid Other | Source: Ambulatory Visit | Attending: Family | Admitting: Family

## 2022-04-19 DIAGNOSIS — N6310 Unspecified lump in the right breast, unspecified quadrant: Secondary | ICD-10-CM | POA: Insufficient documentation

## 2024-02-04 IMAGING — MG DIGITAL DIAGNOSTIC BILAT W/ TOMO W/ CAD
6 of 14 series · 6 of 40 positions shown · non-contrast
Comparison: Previous exam(s).

CLINICAL DATA: 40-year-old female presenting with new bilateral
breast lumps.



[R CC synth-2D (1 of 2)]
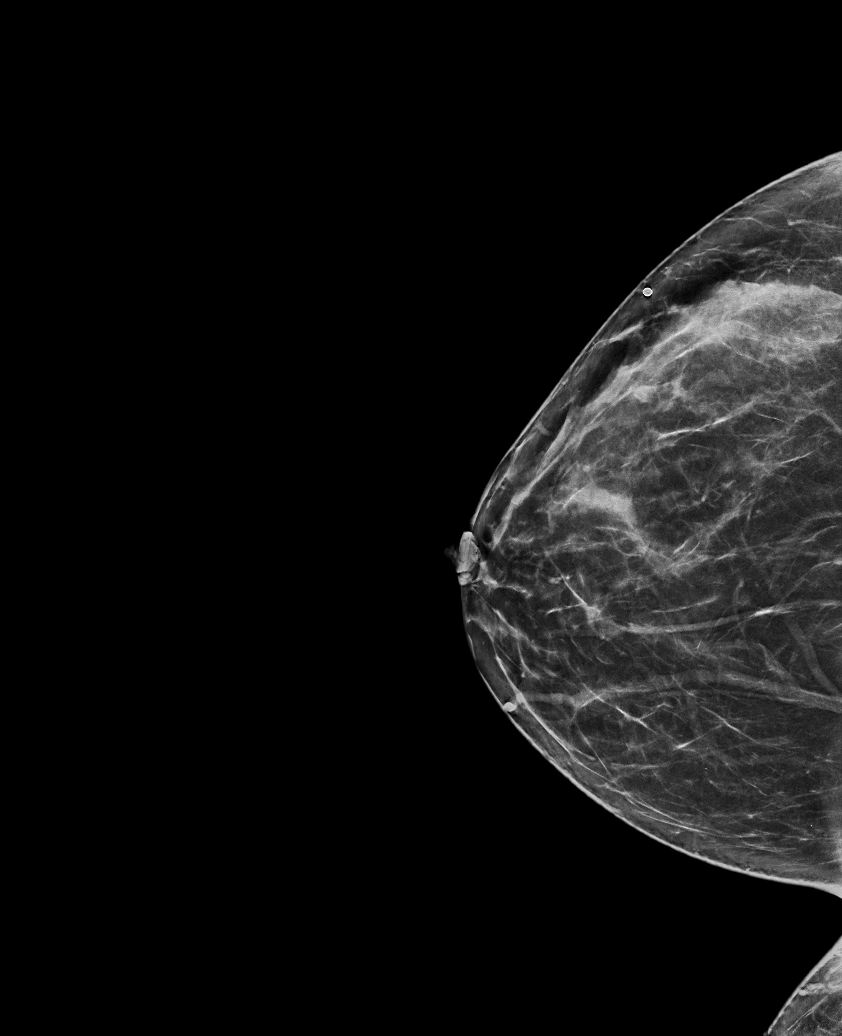

[R CC synth-2D (2 of 2)]
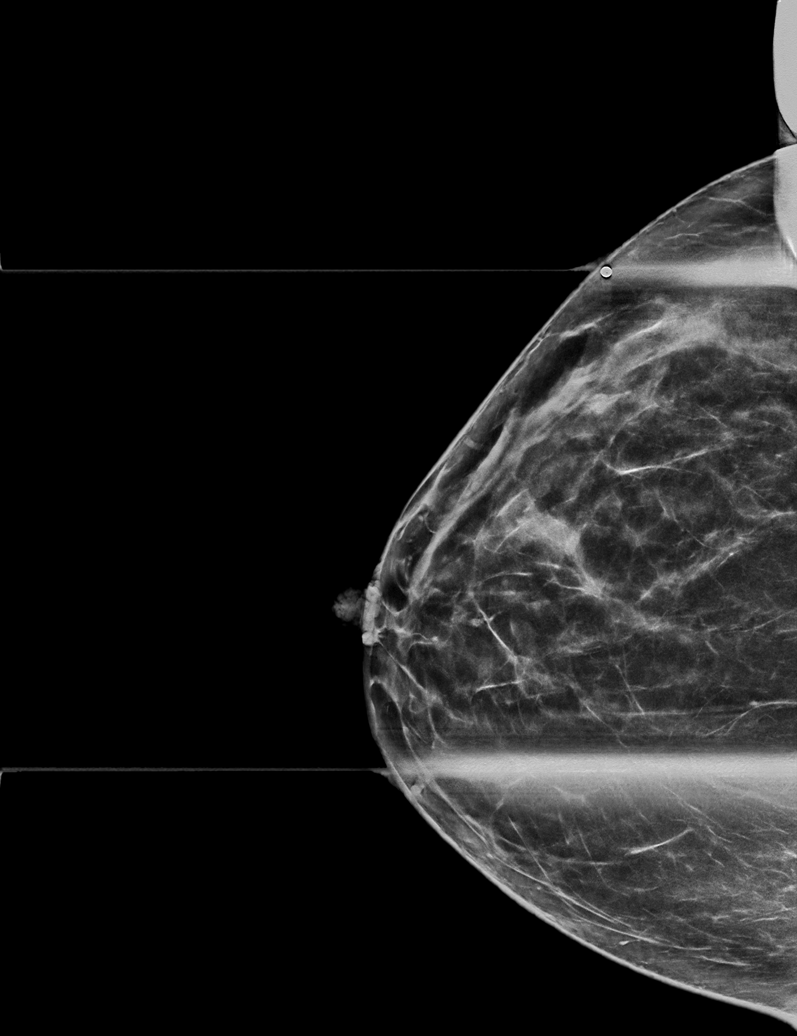

[R MLO synth-2D (1 of 2)]
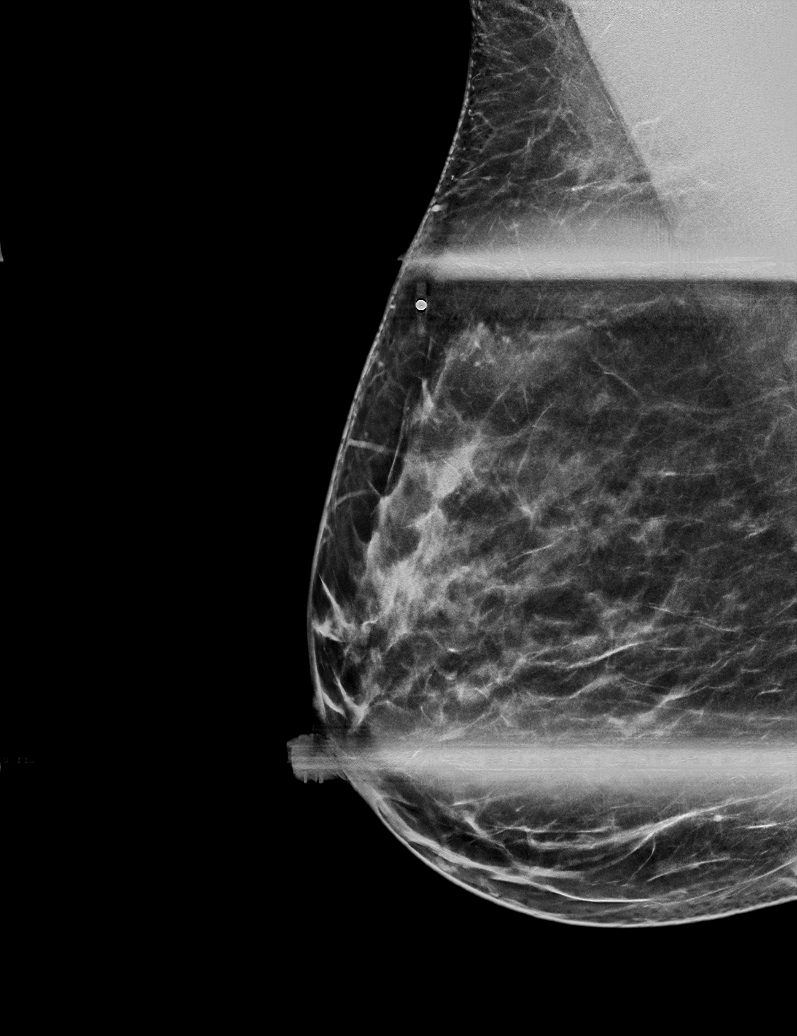

[R MLO synth-2D (2 of 2)]
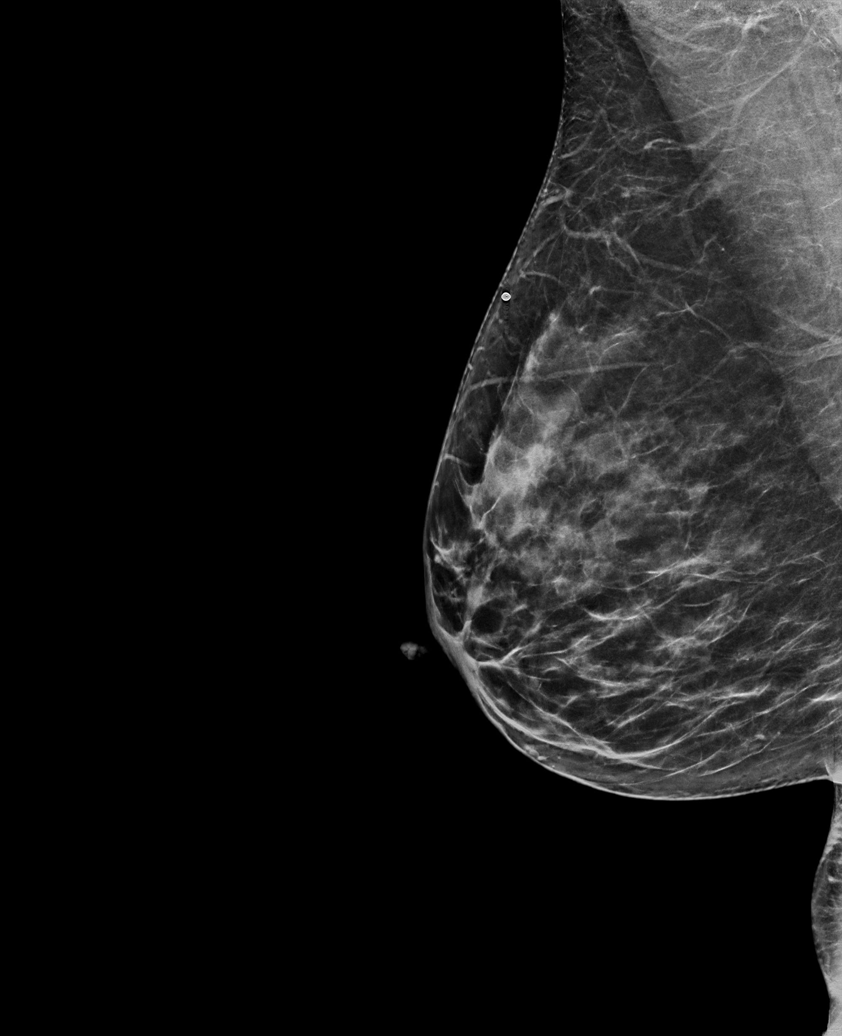

[R ML synth-2D]
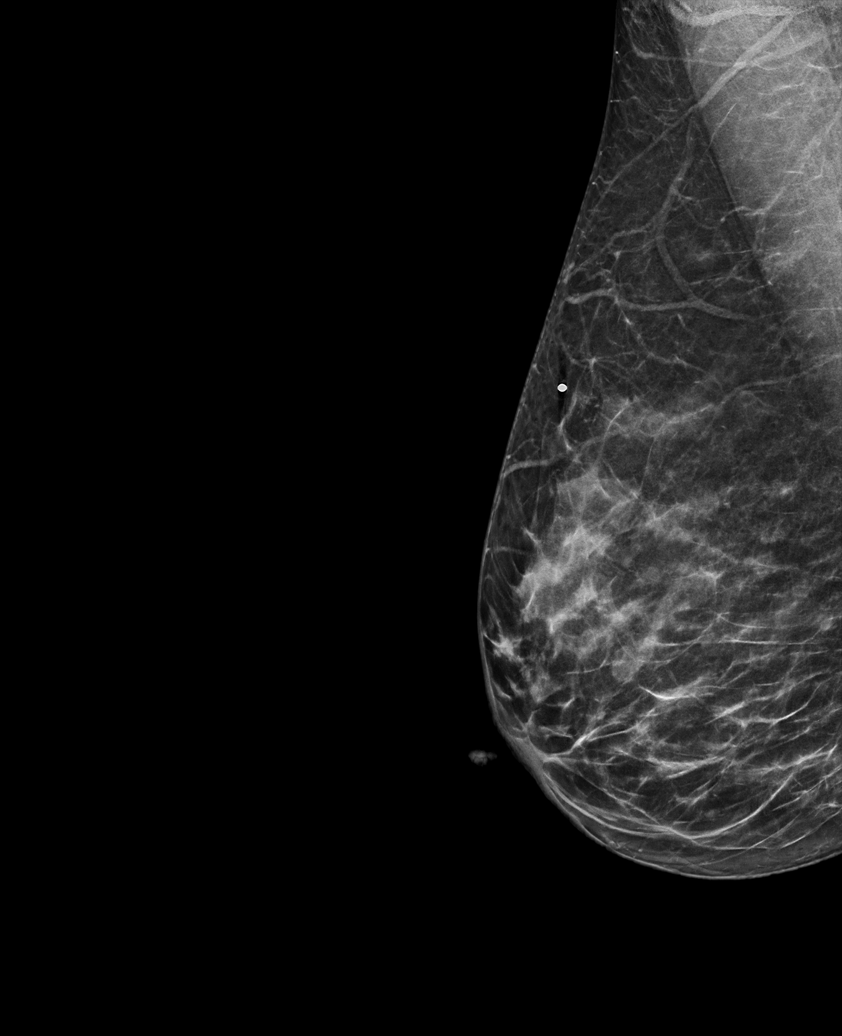

[L CC tomo · tomo slice 37/72.0]
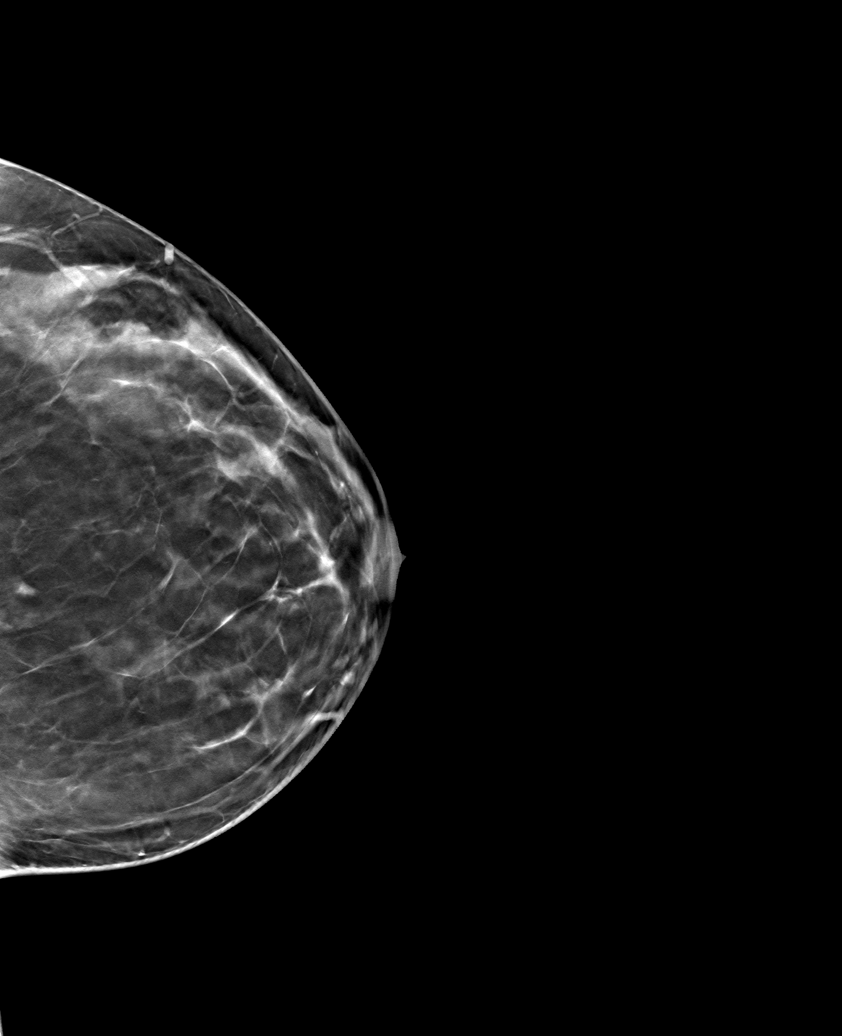

[6 of 40 positions shown; findings below may reference images not displayed]

ACR Breast Density Category c: The breast tissue is heterogeneously
dense, which may obscure small masses.
FINDINGS: Mammogram:

Right breast: A skin BB marks the palpable site of concern reported
by the patient in the upper outer right breast. A spot tangential
view of this area is performed in addition to standard views
demonstrating no new abnormality at the palpable site. There is a
small oval circumscribed mass in the outer right breast that
persists on the spot imaging.

Left breast: A skin BB marks the palpable site of concern reported
by the patient in the upper outer aspect of the left breast. A spot
tangential view of this area was performed in addition to standard
views demonstrating no new abnormality at the palpable site or
elsewhere in the left breast to suggest the presence of malignancy.

On physical exam of the bilateral breast at the palpable sites of
concern I do not feel a fixed discrete mass.

Ultrasound:

Right breast: Targeted ultrasound is performed at the palpable site
of concern at 10 o'clock demonstrating a cluster of anechoic masses
consistent with benign cysts. At 9:30 o'clock and 10 o'clock there
are additional scattered oval circumscribed masses consistent with
benign simple cysts. One of these likely corresponds to the small
mass identified mammographically.

Left breast: Targeted ultrasound is performed in the left breast at
2 o'clock 5 cm from the nipple at the palpable site of concern
demonstrating an oval circumscribed hypoechoic mass measuring 0.6 x
0.3 x 0.4 cm, most likely a complicated cyst.
IMPRESSION: 1. Benign small simple cysts in the outer right breast at the
palpable site of concern

2. Probably benign mass in the left breast at the palpable site of
concern at 2 o'clock, likely a complicated cyst.

RECOMMENDATION:
Left breast ultrasound in 6 months.

I have discussed the findings and recommendations with the patient
who agrees to short-term follow-up. If applicable, a reminder letter
will be sent to the patient regarding the next appointment.

BI-RADS CATEGORY  3: Probably benign.
